# Patient Record
Sex: Female | Born: 1985 | Race: White | Hispanic: No | Marital: Single | Smoking: Never smoker
Health system: Southern US, Community
[De-identification: ages and names within clinical notes are randomized; demographics above are authoritative.]

## PROBLEM LIST (undated history)

## (undated) DIAGNOSIS — I4891 Unspecified atrial fibrillation: Secondary | ICD-10-CM

## (undated) DIAGNOSIS — I341 Nonrheumatic mitral (valve) prolapse: Secondary | ICD-10-CM

## (undated) HISTORY — PX: OTHER SURGICAL HISTORY: SHX169

---

## 2007-01-26 ENCOUNTER — Other Ambulatory Visit: Admission: RE | Admit: 2007-01-26 | Discharge: 2007-01-26 | Payer: Self-pay | Admitting: Obstetrics and Gynecology

## 2008-04-27 ENCOUNTER — Other Ambulatory Visit: Admission: RE | Admit: 2008-04-27 | Discharge: 2008-04-27 | Payer: Self-pay | Admitting: Obstetrics and Gynecology

## 2009-12-05 ENCOUNTER — Ambulatory Visit: Payer: Self-pay | Admitting: Internal Medicine

## 2013-07-04 DIAGNOSIS — Z0000 Encounter for general adult medical examination without abnormal findings: Secondary | ICD-10-CM | POA: Insufficient documentation

## 2013-07-04 HISTORY — DX: Encounter for general adult medical examination without abnormal findings: Z00.00

## 2016-05-09 ENCOUNTER — Encounter (HOSPITAL_COMMUNITY): Payer: Self-pay | Admitting: *Deleted

## 2016-05-09 ENCOUNTER — Emergency Department (HOSPITAL_COMMUNITY)
Admission: EM | Admit: 2016-05-09 | Discharge: 2016-05-09 | Disposition: A | Discharge status: Home / Self Care | Payer: BLUE CROSS/BLUE SHIELD | Attending: Emergency Medicine | Admitting: Emergency Medicine

## 2016-05-09 ENCOUNTER — Emergency Department (HOSPITAL_COMMUNITY): Payer: BLUE CROSS/BLUE SHIELD

## 2016-05-09 DIAGNOSIS — I4891 Unspecified atrial fibrillation: Secondary | ICD-10-CM | POA: Diagnosis not present

## 2016-05-09 DIAGNOSIS — R002 Palpitations: Secondary | ICD-10-CM | POA: Diagnosis not present

## 2016-05-09 DIAGNOSIS — Z7901 Long term (current) use of anticoagulants: Secondary | ICD-10-CM | POA: Diagnosis not present

## 2016-05-09 DIAGNOSIS — I4902 Ventricular flutter: Secondary | ICD-10-CM | POA: Diagnosis not present

## 2016-05-09 HISTORY — DX: Nonrheumatic mitral (valve) prolapse: I34.1

## 2016-05-09 LAB — CBC
HCT: 44.7 % (ref 36.0–46.0)
Hemoglobin: 15.4 g/dL — ABNORMAL HIGH (ref 12.0–15.0)
MCH: 33.9 pg (ref 26.0–34.0)
MCHC: 34.5 g/dL (ref 30.0–36.0)
MCV: 98.5 fL (ref 78.0–100.0)
Platelets: 421 10*3/uL — ABNORMAL HIGH (ref 150–400)
RBC: 4.54 MIL/uL (ref 3.87–5.11)
RDW: 13.4 % (ref 11.5–15.5)
WBC: 7.7 10*3/uL (ref 4.0–10.5)

## 2016-05-09 LAB — TSH: TSH: 1.307 u[IU]/mL (ref 0.350–4.500)

## 2016-05-09 LAB — I-STAT TROPONIN, ED: Troponin i, poc: 0.01 ng/mL (ref 0.00–0.08)

## 2016-05-09 LAB — BASIC METABOLIC PANEL
Anion gap: 15 (ref 5–15)
BUN: 10 mg/dL (ref 6–20)
CO2: 16 mmol/L — ABNORMAL LOW (ref 22–32)
Calcium: 10.3 mg/dL (ref 8.9–10.3)
Chloride: 108 mmol/L (ref 101–111)
Creatinine, Ser: 0.82 mg/dL (ref 0.44–1.00)
GFR calc Af Amer: >60 mL/min (ref >60)
GFR calc non Af Amer: >60 mL/min (ref >60)
Glucose, Bld: 130 mg/dL — ABNORMAL HIGH (ref 65–99)
Potassium: 4.3 mmol/L (ref 3.5–5.1)
Sodium: 139 mmol/L (ref 135–145)

## 2016-05-09 LAB — I-STAT BETA HCG BLOOD, ED (MC, WL, AP ONLY): I-stat hCG, quantitative: <5 m[IU]/mL (ref <5)

## 2016-05-09 MED ORDER — METOPROLOL SUCCINATE ER 25 MG PO TB24: 25.0000 mg | ORAL_TABLET | Freq: Every day | ORAL | 0 refills | Status: DC

## 2016-05-09 MED ORDER — ETOMIDATE 2 MG/ML IV SOLN
8.0000 mg | Freq: Once | INTRAVENOUS | Status: AC
  Administered 2016-05-09: 8 mg via INTRAVENOUS
  Filled 2016-05-09: qty 10

## 2016-05-09 MED ORDER — DILTIAZEM LOAD VIA INFUSION
10.0000 mg | Freq: Once | INTRAVENOUS | Status: DC
  Filled 2016-05-09: qty 10

## 2016-05-09 MED ORDER — RIVAROXABAN 20 MG PO TABS: 20.0000 mg | ORAL_TABLET | Freq: Every day | ORAL | 0 refills | Status: DC

## 2016-05-09 MED ORDER — ETOMIDATE 2 MG/ML IV SOLN: 7.0000 mg | Freq: Once | INTRAVENOUS | Status: DC

## 2016-05-09 MED ORDER — FENTANYL CITRATE (PF) 100 MCG/2ML IJ SOLN
50.0000 ug | Freq: Once | INTRAMUSCULAR | Status: AC
  Administered 2016-05-09: 50 ug via INTRAVENOUS
  Filled 2016-05-09: qty 2

## 2016-05-09 MED ORDER — DILTIAZEM HCL 100 MG IV SOLR
5.0000 mg/h | INTRAVENOUS | Status: DC
  Filled 2016-05-09: qty 100

## 2016-05-09 NOTE — ED Triage Notes (Addendum)
Pt reports onset this am of palpitations and feeling like HR was irregular. Reports sob. Airway intact at triage and ekg done. HR 200 at triage

## 2016-05-09 NOTE — ED Notes (Signed)
EDP and 2 RNs at bedside. Consent for sedation and cardioversion at bedside. Pt placed on Zoll, CO2 monitor, and bedside 12 lead monitor, BP, O2, and RR all stable at this time. Airway cart, suction, and ambu bag at bedside. Pt placed on 2 L of O2 via nasal cannula.

## 2016-05-09 NOTE — Sedation Documentation (Signed)
Synchronized cardioversion with 100 joules performed. Cardiovert successful. Pt converted to sinus tachycardia.

## 2016-05-09 NOTE — ED Notes (Signed)
Called pharmacy and talked to ED pharmacist to see about getting Cardizem STAT

## 2016-05-09 NOTE — Discharge Instructions (Signed)
Avoid ibuprofen and aspirin while you are taking blood thinners.  If you begin to have dizziness while on metoprolol, you may hold on the medication and discuss with Dr. Sharyn LullHarwani.

## 2016-05-09 NOTE — ED Provider Notes (Signed)
[facility]-EMERGENCY DEPT Provider Note   CSN: [id number] Arrival date & time: 05/09/16  1127     History   Chief Complaint Chief Complaint  Patient presents with  . Palpitations    HPI Allison Pope is a 31 y.o. female.  HPI  31 year old with history of mitral valve prolapse presents with concern of acute onset of palpitations, feeling lightheaded, nauseas.  Patient reports she was brushing her teeth and felt like her toothbrush went to far back in her mouth and she gags after which she developed severe palpitations.  Reports she's had palpitations in the past, however she is able to relax and it went away. Reports in the past they have been brought on by caffeine. Denies any caffeine use today. She is has no history of DVT, no PE, no recent surgery or birth control use. Denies shortness of breath.  No recent illnesses.  Father has hx of atrial fibrillation.  Symptoms developed acutely 1 hour ago. Reports she was otherwise in normal state of health.  Reports she had etoh last night, approximately 3 drinks. No other drug use.    Past Medical History:  Diagnosis Date  . MVP (mitral valve prolapse)     There are no active problems to display for this patient.   History reviewed. No pertinent surgical history.  OB History    No data available       Home Medications    Prior to Admission medications   Medication Sig Start Date End Date Taking? Authorizing Provider  B Complex Vitamins (VITAMIN B COMPLEX) TABS Take 1 tablet by mouth 2 (two) times a week.   Yes Historical Provider, MD  metoprolol succinate (TOPROL-XL) 25 MG 24 hr tablet Take 1 tablet (25 mg total) by mouth daily. 05/09/16   Alvira Monday, MD  rivaroxaban (XARELTO) 20 MG TABS tablet Take 1 tablet (20 mg total) by mouth daily with supper. 05/09/16   Alvira Monday, MD    Family History History reviewed. No pertinent family history.  Social History Social History  Substance Use Topics  . Smoking status: Never  Smoker  . Smokeless tobacco: Not on file  . Alcohol use Yes     Comment: occ     Allergies   Patient has no known allergies.   Review of Systems Review of Systems  Constitutional: Positive for fatigue. Negative for fever.  HENT: Negative for sore throat.   Eyes: Negative for visual disturbance.  Respiratory: Negative for cough and shortness of breath.   Cardiovascular: Positive for palpitations. Negative for chest pain and leg swelling.  Gastrointestinal: Positive for nausea. Negative for abdominal pain, diarrhea and vomiting.  Genitourinary: Negative for difficulty urinating.  Musculoskeletal: Negative for back pain and neck pain.  Skin: Negative for rash.  Neurological: Positive for light-headedness. Negative for syncope and headaches.     Physical Exam Updated Vital Signs BP 115/88   Pulse 96   Temp 98.1 F (36.7 C) (Oral)   Resp 17   LMP 05/04/2016   SpO2 93%   Physical Exam  Constitutional: She is oriented to person, place, and time. She appears well-developed and well-nourished. No distress.  HENT:  Head: Normocephalic and atraumatic.  Eyes: Conjunctivae and EOM are normal.  Neck: Normal range of motion.  Cardiovascular: Normal heart sounds and intact distal pulses.  An irregularly irregular rhythm present. Tachycardia present.  Exam reveals no gallop and no friction rub.   No murmur heard. Pulmonary/Chest: Effort normal and breath sounds normal. No respiratory  distress. She has no wheezes. She has no rales.  Abdominal: Soft. She exhibits no distension. There is no tenderness. There is no guarding.  Musculoskeletal: She exhibits no edema or tenderness.  Neurological: She is alert and oriented to person, place, and time.  Skin: Skin is warm and dry. No rash noted. She is not diaphoretic. No erythema.  Nursing note and vitals reviewed.    ED Treatments / Results  Labs (all labs ordered are listed, but only abnormal results are displayed) Labs Reviewed    BASIC METABOLIC PANEL - Abnormal; Notable for the following:       Result Value   CO2 16 (*)    Glucose, Bld 130 (*)    All other components within normal limits  CBC - Abnormal; Notable for the following:    Hemoglobin 15.4 (*)    Platelets 421 (*)    All other components within normal limits  TSH  I-STAT TROPOININ, ED  I-STAT BETA HCG BLOOD, ED ([facility], [facility], [facility] ONLY)    EKG  EKG Interpretation  Date/Time:  Friday May 09 2016 11:39:17 EST Ventricular Rate:  199 PR Interval:    QRS Duration: 80 QT Interval:  228 QTC Calculation: 414 R Axis:   63 Text Interpretation:  Atrial fibrillation with rapid ventricular response Low voltage QRS Nonspecific ST abnormality Abnormal ECG No previous ECGs available Confirmed by [facility] MD, Deamber Buckhalter ([id number]) on 05/09/2016 11:53:52 AM       Radiology Dg Chest Portable 1 View  Result Date: 05/09/2016 CLINICAL DATA:  Palpitations. EXAM: PORTABLE CHEST 1 VIEW COMPARISON:  None. FINDINGS: The heart size and mediastinal contours are within normal limits. Both lungs are clear. The visualized skeletal structures are unremarkable. IMPRESSION: No active disease. Electronically Signed   By: Signa Kell M.D.   On: 05/09/2016 12:13    Procedures .Cardioversion Date/Time: 05/09/2016 5:43 PM Performed by: Alvira Monday Authorized by: Alvira Monday   Consent:    Consent obtained:  Written   Consent given by:  Patient   Risks discussed:  Death and induced arrhythmia   Alternatives discussed:  Rate-control medication Pre-procedure details:    Rhythm:  Atrial fibrillation Attempt one:    Cardioversion mode:  Synchronous   Shock (Joules):  100   Shock outcome:  Conversion to normal sinus rhythm Post-procedure details:    Patient status:  Alert   Patient tolerance of procedure:  Tolerated well, no immediate complications .Sedation Date/Time: 05/09/2016 5:46 PM Performed by: Alvira Monday Authorized by: Alvira Monday   Consent:     Consent obtained:  Written   Consent given by:  Patient   Risks discussed:  Allergic reaction, dysrhythmia, inadequate sedation, nausea, vomiting and respiratory compromise necessitating ventilatory assistance and intubation Indications:    Procedure performed:  Cardioversion   Procedure necessitating sedation performed by:  Physician performing sedation   Intended level of sedation:  Moderate (conscious sedation) Pre-sedation assessment:    ASA classification: class 2 - patient with mild systemic disease     Neck mobility: normal     Mouth opening:  3 or more finger widths   Thyromental distance:  3 finger widths   Mallampati score:  III - soft palate, base of uvula visible   Pre-sedation assessments completed and reviewed: airway patency, cardiovascular function, mental status and nausea/vomiting     History of difficult intubation: no   Immediate pre-procedure details:    Reassessment: Patient reassessed immediately prior to procedure     Reviewed: vital signs and relevant  labs/tests     Verified: bag valve mask available, emergency equipment available, intubation equipment available and IV patency confirmed   Procedure details (see MAR for exact dosages):    Sedation start time:  05/09/2016 12:33 PM Post-procedure details:    Post-sedation assessment completed:  05/09/2016 12:41 PM   Attendance: Constant attendance by certified staff until patient recovered     Recovery: Patient returned to pre-procedure baseline     Patient is stable for discharge or admission: yes     Patient tolerance:  Tolerated well, no immediate complications Comments:     Pt had side effect of myoclonus which resolved, emergence nausea/vomiting. Appropriate sedation however patient reports feeling scared on initial emergence, tearful which resolved.    (including critical care time)  Medications Ordered in ED Medications  etomidate (AMIDATE) injection 8 mg (8 mg Intravenous Given 05/09/16 1237)  fentaNYL  (SUBLIMAZE) injection 50 mcg (50 mcg Intravenous Given 05/09/16 1237)   CRITICAL CARE: atrial fibrillation with RVR Performed by: Rhae LernerSchlossman, Crystalmarie Yasin Elizabeth   Total critical care time: 30 minutes  Critical care time was exclusive of separately billable procedures and treating other patients.  Critical care was necessary to treat or prevent imminent or life-threatening deterioration.  Critical care was time spent personally by me on the following activities: development of treatment plan with patient and/or surrogate as well as nursing, discussions with consultants, evaluation of patient's response to treatment, examination of patient, obtaining history from patient or surrogate, ordering and performing treatments and interventions, ordering and review of laboratory studies, ordering and review of radiographic studies, pulse oximetry and re-evaluation of patient's condition.    Initial Impression / Assessment and Plan / ED Course  I have reviewed the triage vital signs and the nursing notes.  Pertinent labs & imaging results that were available during my care of the patient were reviewed by me and considered in my medical decision making (see chart for details).    31 year old with history of mitral valve prolapse presents with concern of acute onset of palpitations, feeling lightheaded, nausea which began just prior to arrival.  Patient in atrial fibrillation with rapid ventricular response with a rate of 200 on arrival to the emergency department. Reports she's had prior episodes of palpitations, however did not seek care and they resolved spontaneously. Patient mentating well, hemodynamically stable. Discussed options for treatment including cardioversion with discharge, or attempting to use IV medications for rate control.  Given patient with clear onset of symptoms today, feel that cardioversion is appropriate. Discussed risks and benefits of sedation and cardioversion. Patient was sedated  with etomidate and cardioverted to normal sinus rhythm.  She was observed without conversion back to atrial fibrillation. Electrolytes are within normal limits with exception of bicarb. Suspect this is decreased secondary to dehydration, etoh use last night. TSH is within normal limits. Pregnancy test negative. Hgb WNL/likely hemoconcentrated. Given IV fluid. CXR wnl.  Doubt PE given no hypoxia, no dyspnea, no asymmetric leg swelling, no recent surgery.   Discussed with Dr. Sharyn LullHarwani who will see the patient early next week. She was placed on Xarelto and metoprolol 25mg  XL. CHADSVasc of 1. Patient discharged in stable condition with understanding of reasons to return.   Final Clinical Impressions(s) / ED Diagnoses   Final diagnoses:  Atrial fibrillation with RVR [facility](HCC)    New Prescriptions Discharge Medication List as of 05/09/2016  3:17 PM    START taking these medications   Details  metoprolol succinate (TOPROL-XL) 25 MG 24 hr tablet  Take 1 tablet (25 mg total) by mouth daily., Starting Fri 05/09/2016, Print    rivaroxaban (XARELTO) 20 MG TABS tablet Take 1 tablet (20 mg total) by mouth daily with supper., Starting Fri 05/09/2016, Print         Alvira Monday, MD 05/09/16 1754

## 2016-05-09 NOTE — ED Notes (Signed)
EDP at bedside discussing risks and benefits of cardioversion vs IV medication. Patient verbalizes understanding of risks and benefits. Consented to cardioversion.

## 2016-05-13 ENCOUNTER — Ambulatory Visit (HOSPITAL_COMMUNITY): Payer: Self-pay | Admitting: Nurse Practitioner

## 2016-05-14 DIAGNOSIS — I10 Essential (primary) hypertension: Secondary | ICD-10-CM | POA: Diagnosis not present

## 2016-05-14 DIAGNOSIS — I341 Nonrheumatic mitral (valve) prolapse: Secondary | ICD-10-CM | POA: Diagnosis not present

## 2016-05-14 DIAGNOSIS — I480 Paroxysmal atrial fibrillation: Secondary | ICD-10-CM | POA: Diagnosis not present

## 2016-08-09 ENCOUNTER — Emergency Department (HOSPITAL_COMMUNITY): Payer: Self-pay

## 2016-08-09 ENCOUNTER — Encounter (HOSPITAL_COMMUNITY): Payer: Self-pay | Admitting: Nurse Practitioner

## 2016-08-09 ENCOUNTER — Inpatient Hospital Stay (HOSPITAL_COMMUNITY)
Admission: EM | Admit: 2016-08-09 | Discharge: 2016-08-12 | DRG: 439 | Disposition: A | Discharge status: Home / Self Care | Payer: BLUE CROSS/BLUE SHIELD | Attending: Internal Medicine | Admitting: Internal Medicine

## 2016-08-09 DIAGNOSIS — E876 Hypokalemia: Secondary | ICD-10-CM | POA: Diagnosis present

## 2016-08-09 DIAGNOSIS — Z9119 Patient's noncompliance with other medical treatment and regimen: Secondary | ICD-10-CM

## 2016-08-09 DIAGNOSIS — I480 Paroxysmal atrial fibrillation: Secondary | ICD-10-CM | POA: Diagnosis not present

## 2016-08-09 DIAGNOSIS — F1010 Alcohol abuse, uncomplicated: Secondary | ICD-10-CM | POA: Diagnosis present

## 2016-08-09 DIAGNOSIS — F10239 Alcohol dependence with withdrawal, unspecified: Secondary | ICD-10-CM | POA: Diagnosis present

## 2016-08-09 DIAGNOSIS — R079 Chest pain, unspecified: Secondary | ICD-10-CM | POA: Diagnosis not present

## 2016-08-09 DIAGNOSIS — K852 Alcohol induced acute pancreatitis without necrosis or infection: Principal | ICD-10-CM | POA: Diagnosis present

## 2016-08-09 DIAGNOSIS — F419 Anxiety disorder, unspecified: Secondary | ICD-10-CM | POA: Diagnosis present

## 2016-08-09 DIAGNOSIS — Z8249 Family history of ischemic heart disease and other diseases of the circulatory system: Secondary | ICD-10-CM

## 2016-08-09 DIAGNOSIS — K859 Acute pancreatitis without necrosis or infection, unspecified: Secondary | ICD-10-CM | POA: Diagnosis present

## 2016-08-09 DIAGNOSIS — I10 Essential (primary) hypertension: Secondary | ICD-10-CM | POA: Diagnosis present

## 2016-08-09 HISTORY — DX: Acute pancreatitis without necrosis or infection, unspecified: K85.90

## 2016-08-09 HISTORY — DX: Hypokalemia: E87.6

## 2016-08-09 HISTORY — DX: Paroxysmal atrial fibrillation: I48.0

## 2016-08-09 HISTORY — DX: Unspecified atrial fibrillation: I48.91

## 2016-08-09 LAB — CBC
HCT: 36.1 % (ref 36.0–46.0)
Hemoglobin: 12 g/dL (ref 12.0–15.0)
MCH: 33.7 pg (ref 26.0–34.0)
MCHC: 33.2 g/dL (ref 30.0–36.0)
MCV: 101.4 fL — ABNORMAL HIGH (ref 78.0–100.0)
Platelets: 335 10*3/uL (ref 150–400)
RBC: 3.56 MIL/uL — ABNORMAL LOW (ref 3.87–5.11)
RDW: 14.3 % (ref 11.5–15.5)
WBC: 3.9 10*3/uL — ABNORMAL LOW (ref 4.0–10.5)

## 2016-08-09 LAB — COMPREHENSIVE METABOLIC PANEL
ALT: 63 U/L — ABNORMAL HIGH (ref 14–54)
AST: 137 U/L — ABNORMAL HIGH (ref 15–41)
Albumin: 3.8 g/dL (ref 3.5–5.0)
Alkaline Phosphatase: 54 U/L (ref 38–126)
Anion gap: 16 — ABNORMAL HIGH (ref 5–15)
BUN: <5 mg/dL — ABNORMAL LOW (ref 6–20)
CO2: 20 mmol/L — ABNORMAL LOW (ref 22–32)
Calcium: 8.6 mg/dL — ABNORMAL LOW (ref 8.9–10.3)
Chloride: 104 mmol/L (ref 101–111)
Creatinine, Ser: 0.7 mg/dL (ref 0.44–1.00)
GFR calc Af Amer: >60 mL/min (ref >60)
GFR calc non Af Amer: >60 mL/min (ref >60)
Glucose, Bld: 100 mg/dL — ABNORMAL HIGH (ref 65–99)
Potassium: 3.1 mmol/L — ABNORMAL LOW (ref 3.5–5.1)
Sodium: 140 mmol/L (ref 135–145)
Total Bilirubin: 1 mg/dL (ref 0.3–1.2)
Total Protein: 7 g/dL (ref 6.5–8.1)

## 2016-08-09 LAB — I-STAT TROPONIN, ED: Troponin i, poc: 0 ng/mL (ref 0.00–0.08)

## 2016-08-09 LAB — MAGNESIUM: Magnesium: 1.1 mg/dL — ABNORMAL LOW (ref 1.7–2.4)

## 2016-08-09 LAB — LIPASE, BLOOD: Lipase: 311 U/L — ABNORMAL HIGH (ref 11–51)

## 2016-08-09 MED ORDER — POTASSIUM CHLORIDE CRYS ER 20 MEQ PO TBCR
40.0000 meq | EXTENDED_RELEASE_TABLET | Freq: Once | ORAL | Status: AC
  Administered 2016-08-09: 40 meq via ORAL
  Filled 2016-08-09: qty 2

## 2016-08-09 MED ORDER — ONDANSETRON HCL 4 MG/2ML IJ SOLN
4.0000 mg | Freq: Four times a day (QID) | INTRAMUSCULAR | Status: DC | PRN
  Administered 2016-08-09: 4 mg via INTRAVENOUS
  Filled 2016-08-09: qty 2

## 2016-08-09 MED ORDER — ASPIRIN EC 81 MG PO TBEC
81.0000 mg | DELAYED_RELEASE_TABLET | Freq: Every day | ORAL | Status: DC
  Administered 2016-08-10 – 2016-08-12 (×3): 81 mg via ORAL
  Filled 2016-08-09 (×3): qty 1

## 2016-08-09 MED ORDER — HYDROMORPHONE HCL 1 MG/ML IJ SOLN
1.0000 mg | INTRAMUSCULAR | Status: DC | PRN
  Administered 2016-08-09 – 2016-08-10 (×3): 1 mg via INTRAVENOUS
  Filled 2016-08-09 (×3): qty 1

## 2016-08-09 MED ORDER — METOPROLOL SUCCINATE ER 25 MG PO TB24
25.0000 mg | ORAL_TABLET | Freq: Every day | ORAL | Status: DC
  Administered 2016-08-10 – 2016-08-12 (×3): 25 mg via ORAL
  Filled 2016-08-09 (×3): qty 1

## 2016-08-09 MED ORDER — ONDANSETRON HCL 4 MG/2ML IJ SOLN
4.0000 mg | Freq: Once | INTRAMUSCULAR | Status: AC
  Administered 2016-08-09: 4 mg via INTRAVENOUS
  Filled 2016-08-09: qty 2

## 2016-08-09 MED ORDER — GI COCKTAIL ~~LOC~~
30.0000 mL | Freq: Once | ORAL | Status: AC
  Administered 2016-08-09: 30 mL via ORAL
  Filled 2016-08-09: qty 30

## 2016-08-09 MED ORDER — ONDANSETRON HCL 4 MG PO TABS: 8.0000 mg | ORAL_TABLET | Freq: Four times a day (QID) | ORAL | Status: DC | PRN

## 2016-08-09 MED ORDER — LORAZEPAM 2 MG/ML IJ SOLN
0.5000 mg | Freq: Once | INTRAMUSCULAR | Status: AC
  Administered 2016-08-09: 0.5 mg via INTRAVENOUS
  Filled 2016-08-09: qty 1

## 2016-08-09 MED ORDER — ENOXAPARIN SODIUM 40 MG/0.4ML ~~LOC~~ SOLN
40.0000 mg | SUBCUTANEOUS | Status: DC
  Administered 2016-08-09 – 2016-08-11 (×3): 40 mg via SUBCUTANEOUS
  Filled 2016-08-09 (×3): qty 0.4

## 2016-08-09 MED ORDER — SODIUM CHLORIDE 0.9 % IV BOLUS (SEPSIS)
1000.0000 mL | Freq: Once | INTRAVENOUS | Status: AC
  Administered 2016-08-09: 1000 mL via INTRAVENOUS

## 2016-08-09 MED ORDER — SODIUM CHLORIDE 0.9% FLUSH
3.0000 mL | Freq: Two times a day (BID) | INTRAVENOUS | Status: DC
  Administered 2016-08-09 – 2016-08-10 (×2): 3 mL via INTRAVENOUS

## 2016-08-09 MED ORDER — SODIUM CHLORIDE 0.9 % IV SOLN
INTRAVENOUS | Status: DC
  Administered 2016-08-09: 20:00:00 via INTRAVENOUS

## 2016-08-09 MED ORDER — MORPHINE SULFATE (PF) 4 MG/ML IV SOLN
4.0000 mg | Freq: Once | INTRAVENOUS | Status: AC
  Administered 2016-08-09: 4 mg via INTRAVENOUS
  Filled 2016-08-09: qty 1

## 2016-08-09 MED ORDER — POTASSIUM CHLORIDE IN NACL 20-0.9 MEQ/L-% IV SOLN
INTRAVENOUS | Status: DC
  Administered 2016-08-09 – 2016-08-11 (×5): via INTRAVENOUS
  Filled 2016-08-09 (×6): qty 1000

## 2016-08-09 NOTE — ED Triage Notes (Signed)
Per EMS pt from home with sudden onset of chest pain approximately 45 min ago central- left sharp pains with another dull central pain. Patient endorses some shortness of breath associated. Pt has atrial fibrillation and takes metoprolol with no missed doses. She took an extra dose of metoprolol today as well as 324 of ASA prior to ems arrival. EKG- NSR. Pt endorses increased anxiety due to recent death of pet and job loss.

## 2016-08-09 NOTE — H&P (Signed)
History and Physical  Patient Name: Allison Pope     ZOX:[id number]    DOB: 1985-10-17    DOA: 08/09/2016 PCP: No PCP Per Patient   Patient coming from: Home  Chief Complaint: Palpitations, chest pain, vomiting  HPI: Allison Pope is a 31 y.o. female with a past medical history significant for pAF on metoprolol who presents with epigastric pain for 2 days.  Patient was in her usual state of health until a few months ago when she lost her job and her insurance. Since then she's been drinking more heavily (although she only quantifies this as 2-3 glasses of wine per night, and less than a bottle of wine).  Over the last 6 weeks she's had recurrent vomiting, mostly with cough, not related to food. In the last 2 days she developed some substernal chest pain.  This was initially sharp, nonradiating, central, [place] developed into a dull ache in the center of her chest, her vomiting worsening, and today she noticed she had been in A. fib, didn't respond to her home metoprolol so she came to the ER thinking she was having a heart attack.    ED course: -Temp 99.50F, heart rate 74, respirations and pulse is normal, blood pressure 140/109 -Na 140, K 3.1, Cr 0.7, WBC 3.9K, Hgb 12 macrocytic -Lipase 311 -AST 137, AST 63 -Troponin negative -Chest x-ray clear -Upper quadrant ultrasound showed a fatty liver, gallstones, no dilation of the CBD -ECG showed sinus rhythm -She was given GI cocktail which had no effect, then morphine and Zofran which improved her symptoms and TRH were asked to evaluate for pancreatitis     She has no previous history of gallstones, no previous history of pancreatitis. She's never had alcohol withdrawal. She's never had more heavy alcohol use, she describes now, describes no binges with greater than 6 drinks in a sitting.  She developed atrial fibrillation in January, CHADS2-VASc 1, Xarelto for one month, not taking aspirin and metoprolol only.  Saw Dr. Sharyn Lull, has no  insurance, doesn't plan to go back.      ROS: Review of Systems  Cardiovascular: Positive for chest pain and palpitations.  Gastrointestinal: Positive for abdominal pain, nausea and vomiting.  Psychiatric/Behavioral: Positive for substance abuse.  All other systems reviewed and are negative.         Past Medical History:  Diagnosis Date  . Atrial fibrillation (HCC)   . MVP (mitral valve prolapse)     History reviewed. No pertinent surgical history.  Social History: Patient lives alone.  The patient walks unassisted.  She was a Designer, industrial/product.  She is not a smoker.  She is significantly underreporting her alcohol use.  Denies IVDU.  From [place], went to [facility].    No Known Allergies  Family history: family history includes Atrial fibrillation in her father; Congestive Heart Failure in her paternal grandmother.  Prior to Admission medications   Medication Sig Start Date End Date Taking? Authorizing Provider  aspirin-acetaminophen-caffeine (EXCEDRIN MIGRAINE) [phone] MG tablet Take 1 tablet by mouth every 6 (six) hours as needed for headache.   Yes Historical Provider, MD  metoprolol succinate (TOPROL-XL) 25 MG 24 hr tablet Take 1 tablet (25 mg total) by mouth daily. 05/09/16  Yes Alvira Monday, MD       Physical Exam: BP (!) 133/94   Pulse 72   Temp 99.7 F (37.6 C) (Oral)   Resp 15   SpO2 98%  General appearance: Well-developed, adult female, alert and in mild  distress from nausea.   Eyes: Anicteric, conjunctiva pink, lids and lashes normal. PERRL.    ENT: No nasal deformity, discharge, epistaxis.  Hearing normal. OP moist without lesions.   Skin: Warm and dry.  No jaundice.  No suspicious rashes or lesions. Cardiac: RRR, nl S1-S2, no murmurs appreciated.  Capillary refill is brisk.  JVP normal.  No LE edema.  Radial and DP pulses 2+ and symmetric. Respiratory: Normal respiratory rate and rhythm.  CTAB without rales or wheezes. Abdomen: Abdomen soft.  Mild  epigastric TTP. No ascites, distension, hepatosplenomegaly.   MSK: No deformities or effusions.  No cyanosis or clubbing. Neuro: Cranial nerves normal.  Sensation intact to light touch. Speech is fluent.  Muscle strength normal.    Psych: Sensorium intact and responding to questions, attention normal.  Behavior appropriate.  Affect normal.  Judgment and insight appear normal.     Labs on Admission:  I have personally reviewed following labs and imaging studies: CBC:  Recent Labs Lab 08/09/16 1533  WBC 3.9*  HGB 12.0  HCT 36.1  MCV 101.4*  PLT 335   Basic Metabolic Panel:  Recent Labs Lab 08/09/16 1533  NA 140  K 3.1*  CL 104  CO2 20*  GLUCOSE 100*  BUN <5*  CREATININE 0.70  CALCIUM 8.6*   GFR: CrCl cannot be calculated (Unknown ideal weight.).  Liver Function Tests:  Recent Labs Lab 08/09/16 1533  AST 137*  ALT 63*  ALKPHOS 54  BILITOT 1.0  PROT 7.0  ALBUMIN 3.8    Recent Labs Lab 08/09/16 1533  LIPASE 311*   No results for input(s): AMMONIA in the last 168 hours. Coagulation Profile: No results for input(s): INR, PROTIME in the last 168 hours. Cardiac Enzymes: No results for input(s): CKTOTAL, CKMB, CKMBINDEX, TROPONINI in the last 168 hours. BNP (last 3 results) No results for input(s): PROBNP in the last 8760 hours. HbA1C: No results for input(s): HGBA1C in the last 72 hours. CBG: No results for input(s): GLUCAP in the last 168 hours. Lipid Profile: No results for input(s): CHOL, HDL, LDLCALC, TRIG, CHOLHDL, LDLDIRECT in the last 72 hours. Thyroid Function Tests: No results for input(s): TSH, T4TOTAL, FREET4, T3FREE, THYROIDAB in the last 72 hours. Anemia Panel: No results for input(s): VITAMINB12, FOLATE, FERRITIN, TIBC, IRON, RETICCTPCT in the last 72 hours. Sepsis Labs: Invalid input(s): PROCALCITONIN, LACTICIDVEN No results found for this or any previous visit (from the past 240 hour(s)).       Radiological Exams on  Admission: Personally reviewed CXR shows no focal opacity or pneumonia, no edema; US abdomen normal: Dg Chest 2 View  Result Date: 08/09/2016 CLINICAL DATA:  Chest pain EXAM: CHEST  2 VIEW COMPARISON:  May 09, 2016 FINDINGS: Lungs are clear. Heart size and pulmonary vascularity are normal. No adenopathy. No pneumothorax. No bone lesions. IMPRESSION: No edema or consolidation. Electronically Signed   By: Bretta Bang III M.D.   On: 08/09/2016 16:13   US Abdomen Limited  Result Date: 08/09/2016 CLINICAL DATA:  Right upper quadrant pain EXAM: US ABDOMEN LIMITED - RIGHT UPPER QUADRANT COMPARISON:  None. FINDINGS: Gallbladder: Well distended with multiple gallstones within. No wall thickening or pericholecystic fluid is noted. Negative sonographic Murphy's sign is noted. Common bile duct: Diameter: 5 mm Liver: Mildly increased in echogenicity which may be related fatty infiltration. No focal mass is seen. IMPRESSION: Cholelithiasis without complicating factors. Likely fatty infiltration of the liver. Electronically Signed   By: Alcide Clever M.D.   On: 08/09/2016  17:59    EKG: Independently reviewed. Rate 72, sinus rhythm, no ST changes.           Assessment/Plan  1. Acute pancreatitis:  LFTs up.  No previous pancreatitis or biliary disease.     -Trend CMP -If trending up, will consider MRCP to rule out impacted stone -NPO  -MIVF -Hydromorphone and ondansteron for symptom relief -Counseled alcohol reduction   2. Atrial fibrillation:  CHADS2-VASc 1. On metoprolol. -Repeat troponin in AM -Continue metoprolol and aspirin  3. Hypokalemia:  -Check mag -IVF with K -Check BMP tomorrow  4. Elevated LFTs:  She minimizes use even with father out of room, but given macrocytosis and hepatic steatosis and AST/ALT ratio, strongly suspect this is alcoholic hepatitis and pancreatitis. -Trend CMP           DVT prophylaxis: Lovenox  Code Status: FULL  Family Communication:  Father at bedside  Disposition Plan: Anticipate IV fluids, NPO, consevative mgmt of pancreatitis.  She is motivated to go home and may actually be able to be observed overnight, advance to fluids tomorrow and be discharged home tomorrow evening if LFTs trending to normal Consults called: Noen Admission status: INpatient        Medical decision making: Patient seen at 7:30 PM on 08/09/2016.  The patient was discussed with Dr. Anitra Lauth.  What exists of the patient's chart was reviewed in depth and summarized above.  Clinical condition: stable.        Alberteen Sam [facility] Pager [phone]

## 2016-08-09 NOTE — ED Notes (Signed)
Patient is resting

## 2016-08-09 NOTE — ED Provider Notes (Signed)
[facility] DEPT Provider Note   CSN: [id number] Arrival date & time: 08/09/16  1520     History   Chief Complaint Chief Complaint  Patient presents with  . Chest Pain    HPI Allison Pope is a 31 y.o. female.  Patient is a 31 year old female with a significant past medical history of mitral valve prolapse and recently diagnosed atrial fibrillation in January. Patient has been taking metoprolol regularly but no longer takes anticoagulation. Asian states approximately one month ago she lost her job and she has been under a lot of stress and has been very nervous. She has had intermittent bouts of atrial fibrillation which usually resolve with rest and deep breathing. She states today she has felt jittery and nervous. She has had several episodes of atrial fibrillation but around 12:30 she started having chest pressure in the lower sternal area. It patient notes that she has been coughing because of allergies which causes her to gag and vomit. She has been vomiting almost every day that she describes as a yellow substance and bile. She denies any fevers. Also having some mild epigastric tenderness. She denies any complaints of reflux. However she has had decreased sleep and eating less because of all the stress. She denies any depression or suicidal thoughts.   The history is provided by the patient.  Chest Pain   This is a new problem. The current episode started 1 to 2 hours ago. Episode frequency: intermittent. The problem has been gradually improving. The pain is associated with an emotional upset. The pain is present in the substernal region. The pain is at a severity of 5/10. The pain is moderate. The quality of the pain is described as sharp (intially pain was pressure in the bottom of sternum but now just having sharp pangs that last seconds in the same place). The pain does not radiate. Associated symptoms include palpitations and vomiting. Pertinent negatives include no dizziness, no  shortness of breath, no sputum production and no weakness. Associated symptoms comments: Patient has a history of atrial fibrillation and states today she has been having intermittent A. fib all day long but is not currently having it now. When the A. fib comes on she develops shortness of breath and dizziness. However this discomfort in her chest today was different.. She has tried rest for the symptoms. The treatment provided no relief. There are no known risk factors. Past medical history comments: Atrial fibrillation and mitral valve prolapse. Patient only took 30 days of anticoagulation is not taking it currently    Past Medical History:  Diagnosis Date  . Atrial fibrillation (HCC)   . MVP (mitral valve prolapse)     There are no active problems to display for this patient.   History reviewed. No pertinent surgical history.  OB History    No data available       Home Medications    Prior to Admission medications   Medication Sig Start Date End Date Taking? Authorizing Provider  aspirin-acetaminophen-caffeine (EXCEDRIN MIGRAINE) [phone] MG tablet Take 1 tablet by mouth every 6 (six) hours as needed for headache.   Yes Historical Provider, MD  metoprolol succinate (TOPROL-XL) 25 MG 24 hr tablet Take 1 tablet (25 mg total) by mouth daily. 05/09/16  Yes Alvira Monday, MD  rivaroxaban (XARELTO) 20 MG TABS tablet Take 1 tablet (20 mg total) by mouth daily with supper. Patient not taking: Reported on 08/09/2016 05/09/16   Alvira Monday, MD    Family History No  family history on file.  Social History Social History  Substance Use Topics  . Smoking status: Never Smoker  . Smokeless tobacco: Not on file  . Alcohol use Yes     Comment: occ     Allergies   Patient has no known allergies.   Review of Systems Review of Systems  Respiratory: Negative for sputum production and shortness of breath.   Cardiovascular: Positive for chest pain and palpitations.    Gastrointestinal: Positive for vomiting.  Neurological: Negative for dizziness and weakness.  All other systems reviewed and are negative.    Physical Exam Updated Vital Signs BP (!) 140/109 (BP Location: Right Arm)   Pulse 77   Temp 99.7 F (37.6 C) (Oral)   Resp 18   SpO2 97%   Physical Exam  Constitutional: She is oriented to person, place, and time. She appears well-developed and well-nourished.  HENT:  Head: Normocephalic and atraumatic.  Mouth/Throat: Oropharynx is clear and moist.  Eyes: Conjunctivae and EOM are normal. Pupils are equal, round, and reactive to light.  Neck: Normal range of motion. Neck supple.  Cardiovascular: Normal rate, regular rhythm and intact distal pulses.   No murmur heard. Pulmonary/Chest: Effort normal and breath sounds normal. No respiratory distress. She has no wheezes. She has no rales.  Abdominal: Soft. She exhibits no distension. There is tenderness in the epigastric area. There is no rebound and no guarding.  Mild epigastric tenderness  Musculoskeletal: Normal range of motion. She exhibits no edema or tenderness.  Neurological: She is alert and oriented to person, place, and time.  Skin: Skin is warm and dry. No rash noted. No erythema.  Psychiatric: She has a normal mood and affect. Her behavior is normal.  Seems slightly anxious  Nursing note and vitals reviewed.    ED Treatments / Results  Labs (all labs ordered are listed, but only abnormal results are displayed) Labs Reviewed  CBC - Abnormal; Notable for the following:       Result Value   WBC 3.9 (*)    RBC 3.56 (*)    MCV 101.4 (*)    All other components within normal limits  COMPREHENSIVE METABOLIC PANEL - Abnormal; Notable for the following:    Potassium 3.1 (*)    CO2 20 (*)    Glucose, Bld 100 (*)    BUN <5 (*)    Calcium 8.6 (*)    AST 137 (*)    ALT 63 (*)    Anion gap 16 (*)    All other components within normal limits  LIPASE, BLOOD - Abnormal; Notable  for the following:    Lipase 311 (*)    All other components within normal limits  I-STAT TROPOININ, ED    EKG  EKG Interpretation  Date/Time:  Saturday August 09 2016 15:26:44 EDT Ventricular Rate:  76 PR Interval:    QRS Duration: 102 QT Interval:  406 QTC Calculation: 457 R Axis:   28 Text Interpretation:  Sinus rhythm Consider anterior infarct Confirmed by Anitra Lauth  MD, Jelitza Manninen ([id number]) on 08/09/2016 3:30:59 PM       Radiology Dg Chest 2 View  Result Date: 08/09/2016 CLINICAL DATA:  Chest pain EXAM: CHEST  2 VIEW COMPARISON:  May 09, 2016 FINDINGS: Lungs are clear. Heart size and pulmonary vascularity are normal. No adenopathy. No pneumothorax. No bone lesions. IMPRESSION: No edema or consolidation. Electronically Signed   By: Bretta Bang III M.D.   On: 08/09/2016 16:13   US Abdomen Limited  Result Date: 08/09/2016 CLINICAL DATA:  Right upper quadrant pain EXAM: US ABDOMEN LIMITED - RIGHT UPPER QUADRANT COMPARISON:  None. FINDINGS: Gallbladder: Well distended with multiple gallstones within. No wall thickening or pericholecystic fluid is noted. Negative sonographic Murphy's sign is noted. Common bile duct: Diameter: 5 mm Liver: Mildly increased in echogenicity which may be related fatty infiltration. No focal mass is seen. IMPRESSION: Cholelithiasis without complicating factors. Likely fatty infiltration of the liver. Electronically Signed   By: Alcide Clever M.D.   On: 08/09/2016 17:59    Procedures Procedures (including critical care time)  Medications Ordered in ED Medications  gi cocktail (Maalox,Lidocaine,Donnatal) (30 mLs Oral Given 08/09/16 1549)  LORazepam (ATIVAN) injection 0.5 mg (0.5 mg Intravenous Given 08/09/16 1549)     Initial Impression / Assessment and Plan / ED Course  I have reviewed the triage vital signs and the nursing notes.  Pertinent labs & imaging results that were available during my care of the patient were reviewed by me and considered  in my medical decision making (see chart for details).     Patient presenting today with atypical chest pain that started this afternoon. She describes pain as a pressure but that is gone and now having the sharp tinges of pain. Feel most likely the pain is GI related. patient has been under a lot of stress recently and has had insomnia and decreased eating. She is been vomiting more and has mild epigastric tenderness. She also seems nervous on exam and states the chest from being under all the stress of recently losing her job. She denies taking any stimulants, over-the-counter cough or cold medications.  Patient is in sinus rhythm today and EKG without ST elevation or concerns of ischemia. She has no symptoms concerning for pericarditis. Symptoms are not significant for PE or dissection. PERC neg.  LMP was a few days ago and low suspicion that patient has appendicitis, ectopic pregnancy or other abdominal process. Chest x-ray within normal limits today, CBC with mild leukopenia but otherwise normal hemoglobin. Patient had TSH checked in January which was within normal limits. She was given some Ativan and GI cocktail.  5:08 PM Labs are significant for elevated LFTs AST greater than ALT and elevated lipase at 311. Patient does state she has been drinking 2-3 glasses of alcohol in the evenings since losing her job with the stress. On reevaluation she denies right upper quadrant pain but on exam she has a mild Murphy sign. Patient was given pain control and IV fluids. GI cocktail only minimally improved her pain. Will do a right upper quadrant ultrasound to ensure no gallbladder issues. There is no medication she is on which would cause this. Could be related to her recent increased alcohol use.  6:35 PM U/S without biliary obstruction.  Will admit for pancreatitis  Final Clinical Impressions(s) / ED Diagnoses   Final diagnoses:  Acute pancreatitis without infection or necrosis, unspecified  pancreatitis type    New Prescriptions New Prescriptions   No medications on file     Gwyneth Sprout, MD 08/09/16 1836

## 2016-08-09 NOTE — ED Notes (Signed)
Pt transported to US

## 2016-08-10 DIAGNOSIS — E876 Hypokalemia: Secondary | ICD-10-CM | POA: Diagnosis not present

## 2016-08-10 DIAGNOSIS — F1010 Alcohol abuse, uncomplicated: Secondary | ICD-10-CM | POA: Diagnosis not present

## 2016-08-10 DIAGNOSIS — I480 Paroxysmal atrial fibrillation: Secondary | ICD-10-CM

## 2016-08-10 DIAGNOSIS — K859 Acute pancreatitis without necrosis or infection, unspecified: Secondary | ICD-10-CM | POA: Diagnosis not present

## 2016-08-10 LAB — COMPREHENSIVE METABOLIC PANEL
ALT: 50 U/L (ref 14–54)
AST: 90 U/L — ABNORMAL HIGH (ref 15–41)
Albumin: 3.5 g/dL (ref 3.5–5.0)
Alkaline Phosphatase: 47 U/L (ref 38–126)
Anion gap: 11 (ref 5–15)
BUN: <5 mg/dL — ABNORMAL LOW (ref 6–20)
CO2: 24 mmol/L (ref 22–32)
Calcium: 8 mg/dL — ABNORMAL LOW (ref 8.9–10.3)
Chloride: 104 mmol/L (ref 101–111)
Creatinine, Ser: 0.76 mg/dL (ref 0.44–1.00)
GFR calc Af Amer: >60 mL/min (ref >60)
GFR calc non Af Amer: >60 mL/min (ref >60)
Glucose, Bld: 78 mg/dL (ref 65–99)
Potassium: 3.4 mmol/L — ABNORMAL LOW (ref 3.5–5.1)
Sodium: 139 mmol/L (ref 135–145)
Total Bilirubin: 1.1 mg/dL (ref 0.3–1.2)
Total Protein: 6.2 g/dL — ABNORMAL LOW (ref 6.5–8.1)

## 2016-08-10 LAB — TROPONIN I: Troponin I: <0.03 ng/mL (ref <0.03)

## 2016-08-10 LAB — LIPID PANEL
Cholesterol: 176 mg/dL (ref 0–200)
HDL: 74 mg/dL (ref >40)
LDL Cholesterol: 81 mg/dL (ref 0–99)
Total CHOL/HDL Ratio: 2.4 RATIO
Triglycerides: 105 mg/dL (ref <150)
VLDL: 21 mg/dL (ref 0–40)

## 2016-08-10 LAB — HIV ANTIBODY (ROUTINE TESTING W REFLEX): HIV Screen 4th Generation wRfx: NONREACTIVE

## 2016-08-10 LAB — MAGNESIUM: Magnesium: 1.2 mg/dL — ABNORMAL LOW (ref 1.7–2.4)

## 2016-08-10 LAB — VITAMIN B12: Vitamin B-12: 1046 pg/mL — ABNORMAL HIGH (ref 180–914)

## 2016-08-10 LAB — LIPASE, BLOOD: Lipase: 280 U/L — ABNORMAL HIGH (ref 11–51)

## 2016-08-10 MED ORDER — FOLIC ACID 1 MG PO TABS
1.0000 mg | ORAL_TABLET | Freq: Every day | ORAL | Status: DC
  Administered 2016-08-10 – 2016-08-12 (×3): 1 mg via ORAL
  Filled 2016-08-10 (×3): qty 1

## 2016-08-10 MED ORDER — THIAMINE HCL 100 MG/ML IJ SOLN: 100.0000 mg | Freq: Every day | INTRAMUSCULAR | Status: DC

## 2016-08-10 MED ORDER — POTASSIUM CHLORIDE CRYS ER 20 MEQ PO TBCR
20.0000 meq | EXTENDED_RELEASE_TABLET | Freq: Once | ORAL | Status: AC
  Administered 2016-08-10: 20 meq via ORAL
  Filled 2016-08-10: qty 1

## 2016-08-10 MED ORDER — LORAZEPAM 2 MG/ML IJ SOLN
1.0000 mg | Freq: Four times a day (QID) | INTRAMUSCULAR | Status: DC | PRN
  Administered 2016-08-11: 1 mg via INTRAVENOUS
  Filled 2016-08-10: qty 1

## 2016-08-10 MED ORDER — VITAMIN B-1 100 MG PO TABS
100.0000 mg | ORAL_TABLET | Freq: Every day | ORAL | Status: DC
  Administered 2016-08-10 – 2016-08-12 (×3): 100 mg via ORAL
  Filled 2016-08-10 (×3): qty 1

## 2016-08-10 MED ORDER — MAGNESIUM SULFATE 4 GM/100ML IV SOLN
4.0000 g | Freq: Once | INTRAVENOUS | Status: AC
  Administered 2016-08-10: 4 g via INTRAVENOUS
  Filled 2016-08-10: qty 100

## 2016-08-10 MED ORDER — HYDRALAZINE HCL 20 MG/ML IJ SOLN: 10.0000 mg | Freq: Four times a day (QID) | INTRAMUSCULAR | Status: DC | PRN

## 2016-08-10 MED ORDER — MAGNESIUM SULFATE 50 % IJ SOLN
4.0000 g | Freq: Once | INTRAVENOUS | Status: DC
  Filled 2016-08-10: qty 8

## 2016-08-10 MED ORDER — ADULT MULTIVITAMIN W/MINERALS CH
1.0000 | ORAL_TABLET | Freq: Every day | ORAL | Status: DC
  Administered 2016-08-10 – 2016-08-12 (×3): 1 via ORAL
  Filled 2016-08-10 (×3): qty 1

## 2016-08-10 MED ORDER — LORAZEPAM 1 MG PO TABS
1.0000 mg | ORAL_TABLET | Freq: Four times a day (QID) | ORAL | Status: DC | PRN
  Administered 2016-08-10: 1 mg via ORAL
  Filled 2016-08-10: qty 1

## 2016-08-10 NOTE — Progress Notes (Signed)
Patient removed telemetry from her,nurse explained to her the purpose of it and asked permission to placed back to her but the patient refused.She said ''that's the one hurts me on my chest.I don't want that back to me''.

## 2016-08-10 NOTE — Progress Notes (Signed)
[facility]                                                                              Patient Demographics  Allison Pope, is a 31 y.o. female, DOB - 1985-11-04, ZOX:[id number]  Admit date - 08/09/2016   Admitting Physician Alberteen Sam, MD  Outpatient Primary MD for the patient is No PCP Per Patient  Outpatient specialists:   LOS - 1  days    Chief Complaint  Patient presents with  . Chest Pain       Brief summary   Patient is a 31 year old female with history of paroxysmal atrial fibrillation, presented with epigastric pain for 2 days. Patient reported she lost her job, insurance and has been drinking alcohol more heavily. Over the last 6 weeks she had recurrent vomiting, and the last 2 days. Some substernal chest pain. Patient presented to the ED for further workup. Patient was found to have lipase of 311, AST 137, AST 63. Patient was admitted for acute pancreatitis   Assessment & Plan    Principal Problem:   Acute pancreatitis without any necrosis:  - LFTs up. No previous pancreatitis or biliary disease.     - Ultrasound of the abdomen negative for any cholelithiasis, possibly alcohol induced - AST ALT with a pattern of EtOH use, lipase trending down, triglycerides 105 - States abdominal pain is improving, continue IV fluids, start clear liquid diet, pain control -Counseled alcohol reduction    Atrial fibrillation:  CHADS2-VASc 1. On metoprolol. -Serial troponins negative -Continue metoprolol and aspirin   Hypokalemia:  -Replaced  Severe hypomagnesemia - Replaced IV  Elevated LFTs:  She minimizes use even with father out of room, but given macrocytosis and hepatic steatosis and AST/ALT ratio, strongly suspect this is alcoholic hepatitis and pancreatitis. -LFTs improving  Alcohol abuse - placed on CIWA protocol with Ativan - Continue IV fluids, thiamine, folate, MVI  Code Status: Full CODE STATUS DVT Prophylaxis:  Lovenox    Family Communication: Discussed in detail with the patient, all imaging results, lab results explained to the patient   Disposition Plan: Hopefully next 24-48 hours if tolerating solids  Time Spent in minutes   25 minutes  Procedures:  None  Consultants:   None  Antimicrobials:      Medications  Scheduled Meds: . aspirin EC  81 mg Oral Daily  . enoxaparin (LOVENOX) injection  40 mg Subcutaneous Q24H  . folic acid  1 mg Oral Daily  . metoprolol succinate  25 mg Oral Daily  . multivitamin with minerals  1 tablet Oral Daily  . sodium chloride flush  3 mL Intravenous Q12H  . thiamine  100 mg Oral Daily   Or  . thiamine  100 mg Intravenous Daily   Continuous Infusions: . 0.9 % NaCl with KCl 20 mEq / L 150 mL/hr at 08/10/16 0631   PRN Meds:.HYDROmorphone (DILAUDID) injection, LORazepam **OR** LORazepam, ondansetron **OR** ondansetron (ZOFRAN) IV   Antibiotics   Anti-infectives    None        Subjective:   Allison Pope was seen and examined today. Nausea and vomiting improved, abdominal pain is  improving. Afebrile. Patient denies dizziness, chest pain, shortness of breath, D/C, new weakness, numbess, tingling. No acute events overnight.    Objective:   Vitals:   08/09/16 2100 08/10/16 0715 08/10/16 0730 08/10/16 0845  BP: (!) 165/94 (!) 148/95  (!) 167/108  Pulse: 66 72 70 64  Resp: Temp: 98.1 F (36.7 C) 98 F (36.7 C)  98 F (36.7 C)  TempSrc: Oral Oral  Oral  SpO2: 97% 97%  98%  Weight: 90.1 kg (198 lb 9.5 oz)       Intake/Output Summary (Last 24 hours) at 08/10/16 1043 Last data filed at 08/10/16 0935  Gross per 24 hour  Intake          2194.17 ml  Output                0 ml  Net          2194.17 ml     Wt Readings from Last 3 Encounters:  08/09/16 90.1 kg (198 lb 9.5 oz)     Exam  General: Alert and oriented x 3, NAD  HEENT:    Neck: Supple, no JVD, no masses  Cardiovascular: S1 S2 auscultated, no rubs, murmurs or  gallops. Regular rate and rhythm.  Respiratory: Clear to auscultation bilaterally, no wheezing, rales or rhonchi  Gastrointestinal: Soft, Minimal epigastric tenderness, nondistended, + bowel sounds  Ext: no cyanosis clubbing or edema  Neuro: AAOx3, Cr N's II- XII. Strength 5/5 upper and lower extremities bilaterally  Skin: No rashes  Psych: Normal affect and demeanor, alert and oriented x3    Data Reviewed:  I have personally reviewed following labs and imaging studies  Micro Results No results found for this or any previous visit (from the past 240 hour(s)).  Radiology Reports Dg Chest 2 View  Result Date: 08/09/2016 CLINICAL DATA:  Chest pain EXAM: CHEST  2 VIEW COMPARISON:  May 09, 2016 FINDINGS: Lungs are clear. Heart size and pulmonary vascularity are normal. No adenopathy. No pneumothorax. No bone lesions. IMPRESSION: No edema or consolidation. Electronically Signed   By: Bretta Bang III M.D.   On: 08/09/2016 16:13   US Abdomen Limited  Result Date: 08/09/2016 CLINICAL DATA:  Right upper quadrant pain EXAM: US ABDOMEN LIMITED - RIGHT UPPER QUADRANT COMPARISON:  None. FINDINGS: Gallbladder: Well distended with multiple gallstones within. No wall thickening or pericholecystic fluid is noted. Negative sonographic Murphy's sign is noted. Common bile duct: Diameter: 5 mm Liver: Mildly increased in echogenicity which may be related fatty infiltration. No focal mass is seen. IMPRESSION: Cholelithiasis without complicating factors. Likely fatty infiltration of the liver. Electronically Signed   By: Alcide Clever M.D.   On: 08/09/2016 17:59    Lab Data:  CBC:  Recent Labs Lab 08/09/16 1533  WBC 3.9*  HGB 12.0  HCT 36.1  MCV 101.4*  PLT 335   Basic Metabolic Panel:  Recent Labs Lab 08/09/16 1533 08/09/16 2104 08/10/16 0442  NA 140  --  139  K 3.1*  --  3.4*  CL 104  --  104  CO2 20*  --  24  GLUCOSE 100*  --  78  BUN <5*  --  <5*  CREATININE 0.70  --  0.76   CALCIUM 8.6*  --  8.0*  MG  --  1.1* 1.2*   GFR: CrCl cannot be calculated (Unknown ideal weight.). Liver Function Tests:  Recent Labs Lab 08/09/16 1533 08/10/16 0442  AST 137* 90*  ALT 63* 50  ALKPHOS 54 47  BILITOT 1.0 1.1  PROT 7.0 6.2*  ALBUMIN 3.8 3.5    Recent Labs Lab 08/09/16 1533 08/10/16 0442  LIPASE 311* 280*   No results for input(s): AMMONIA in the last 168 hours. Coagulation Profile: No results for input(s): INR, PROTIME in the last 168 hours. Cardiac Enzymes:  Recent Labs Lab 08/10/16 0442  TROPONINI <0.03   BNP (last 3 results) No results for input(s): PROBNP in the last 8760 hours. HbA1C: No results for input(s): HGBA1C in the last 72 hours. CBG: No results for input(s): GLUCAP in the last 168 hours. Lipid Profile:  Recent Labs  08/10/16 0744  CHOL 176  HDL 74  LDLCALC 81  TRIG 105  CHOLHDL 2.4   Thyroid Function Tests: No results for input(s): TSH, T4TOTAL, FREET4, T3FREE, THYROIDAB in the last 72 hours. Anemia Panel:  Recent Labs  08/10/16 0442  VITAMINB12 1,046*   Urine analysis: No results found for: COLORURINE, APPEARANCEUR, LABSPEC, PHURINE, GLUCOSEU, HGBUR, BILIRUBINUR, KETONESUR, PROTEINUR, UROBILINOGEN, NITRITE, LEUKOCYTESUR   Ripudeep Rai M.D. [facility] 08/10/2016, 10:43 AM  Pager: [phone] Between 7am to 7pm - call Pager - [phone]  After 7pm go to [email or web address] - password [email or web address]  Call night coverage person covering after 7pm

## 2016-08-10 NOTE — Progress Notes (Signed)
Called CCMD for tele review for the last 12 hours,been NSR since admission.

## 2016-08-10 NOTE — Progress Notes (Signed)
Patient totally removed all the leads of her telemetry.Nurse explained to her the purpose of telemetry.Patient agreed to placed it back.

## 2016-08-11 DIAGNOSIS — K859 Acute pancreatitis without necrosis or infection, unspecified: Secondary | ICD-10-CM | POA: Diagnosis not present

## 2016-08-11 DIAGNOSIS — I480 Paroxysmal atrial fibrillation: Secondary | ICD-10-CM | POA: Diagnosis not present

## 2016-08-11 DIAGNOSIS — E876 Hypokalemia: Secondary | ICD-10-CM | POA: Diagnosis not present

## 2016-08-11 LAB — COMPREHENSIVE METABOLIC PANEL
ALT: 70 U/L — ABNORMAL HIGH (ref 14–54)
AST: 130 U/L — ABNORMAL HIGH (ref 15–41)
Albumin: 4.1 g/dL (ref 3.5–5.0)
Alkaline Phosphatase: 55 U/L (ref 38–126)
Anion gap: 12 (ref 5–15)
BUN: <5 mg/dL — ABNORMAL LOW (ref 6–20)
CO2: 20 mmol/L — ABNORMAL LOW (ref 22–32)
Calcium: 8.1 mg/dL — ABNORMAL LOW (ref 8.9–10.3)
Chloride: 102 mmol/L (ref 101–111)
Creatinine, Ser: 0.63 mg/dL (ref 0.44–1.00)
GFR calc Af Amer: >60 mL/min (ref >60)
GFR calc non Af Amer: >60 mL/min (ref >60)
Glucose, Bld: 83 mg/dL (ref 65–99)
Potassium: 3.7 mmol/L (ref 3.5–5.1)
Sodium: 134 mmol/L — ABNORMAL LOW (ref 135–145)
Total Bilirubin: 1.3 mg/dL — ABNORMAL HIGH (ref 0.3–1.2)
Total Protein: 7.1 g/dL (ref 6.5–8.1)

## 2016-08-11 LAB — LIPASE, BLOOD: Lipase: 192 U/L — ABNORMAL HIGH (ref 11–51)

## 2016-08-11 LAB — MAGNESIUM: Magnesium: 1.8 mg/dL (ref 1.7–2.4)

## 2016-08-11 MED ORDER — WHITE PETROLATUM GEL
Status: AC
  Filled 2016-08-11: qty 1

## 2016-08-11 MED ORDER — AMLODIPINE BESYLATE 5 MG PO TABS: 5.0000 mg | ORAL_TABLET | Freq: Every day | ORAL | 1 refills | Status: DC

## 2016-08-11 MED ORDER — AMLODIPINE BESYLATE 5 MG PO TABS
5.0000 mg | ORAL_TABLET | Freq: Every day | ORAL | Status: DC
Start: 2016-08-11 — End: 2016-08-12
  Administered 2016-08-11 – 2016-08-12 (×2): 5 mg via ORAL
  Filled 2016-08-11 (×2): qty 1

## 2016-08-11 MED ORDER — THIAMINE HCL 100 MG PO TABS: 100.0000 mg | ORAL_TABLET | Freq: Every day | ORAL | 3 refills | Status: DC

## 2016-08-11 MED ORDER — PROMETHAZINE HCL 25 MG PO TABS: 25.0000 mg | ORAL_TABLET | Freq: Three times a day (TID) | ORAL | 0 refills | Status: DC | PRN

## 2016-08-11 MED ORDER — TRAMADOL HCL 50 MG PO TABS: 50.0000 mg | ORAL_TABLET | Freq: Three times a day (TID) | ORAL | 0 refills | Status: DC | PRN

## 2016-08-11 MED ORDER — LORAZEPAM 2 MG/ML IJ SOLN
2.0000 mg | Freq: Once | INTRAMUSCULAR | Status: AC
  Administered 2016-08-11: 2 mg via INTRAVENOUS
  Filled 2016-08-11: qty 1

## 2016-08-11 NOTE — Care Management Note (Signed)
Case Management Note  Patient Details  Name: Allison Pope MRN: [id number] Date of Birth: Mar 30, 1986  Subjective/Objective:        CM following for progression and d/c planning.             Action/Plan: Pt scheduled at [facility] for hospital follow up as overflow due to lack of availability at [facility] and [facility]. Pt informed.   Expected Discharge Date:   08/11/2016               Expected Discharge Plan:  Home/Self Care  In-House Referral:  NA  Discharge planning Services  CM Consult, [facility]  Post Acute Care Choice:  NA Choice offered to:  Patient  DME Arranged:  N/A DME Agency:  NA  HH Arranged:  NA HH Agency:  NA  Status of Service:  Completed, signed off  If discussed at Long Length of Stay Meetings, dates discussed:    Additional Comments:  Starlyn Skeans, RN 08/11/2016, 9:47 AM

## 2016-08-11 NOTE — Progress Notes (Signed)
Per Dr. Isidoro Donning pt is now involuntary committed. Dr. Isidoro Donning talked to patient about this decision and plans to reassess her in the morning. Patients belongings were taken from room except glasses, a book, and contact case. Safety sitter at bedside. Will continue to monitor closely.

## 2016-08-11 NOTE — Progress Notes (Signed)
Pt is alert and oriented x4. Pt is calm, happy and cooperative. Pt tolerated lunch well.  CIWA 0. Will continue to monitor patient.

## 2016-08-11 NOTE — Progress Notes (Signed)
Nutrition Brief Note  Patient identified on the Malnutrition Screening Tool (MST) Report  Wt Readings from Last 15 Encounters:  08/10/16 207 lb 14.4 oz (94.3 kg)    Body mass index is 33.56 kg/m. Patient meets criteria for obesity unspecified based on current BMI.   Current diet order is Heart Healthy, patient is consuming approximately 75-100% of meals at this time. Labs and medications reviewed.   No nutrition interventions warranted at this time. If nutrition issues arise, please consult RD.   Romelle Starcher MS, RD, LDN [phone] Pager  [phone] Weekend/On-Call Pager

## 2016-08-11 NOTE — Progress Notes (Signed)
Patient came out into the hallway stating there were snakes in her room.  I informed patient there were no snacks found in her room.  Patient still stating there are snakes in her room. Eber Jones ,RN notified.

## 2016-08-11 NOTE — Progress Notes (Signed)
While doing my initial assessment on this patient she is standing outside the room with her belongings stating that "there are snakes all over the floor in her room." She told me they were wrapped around the wheels of the bed and recliner. Patient also states that she is not going to eat her dinner; "I am not hungry, I eat healthy and the food here is not healthy. I am 31 and do not need anyone to tell me when to eat. Notified Dr. Isidoro Donning. Awaiting MD response. Will continue to monitor closely.

## 2016-08-11 NOTE — Progress Notes (Signed)
Went to hang bag of IVF and patient was anxious and trying to take out her IV. I then saw the patient grab what looked like a knife off the sink and placed it in her bag. I calmed the patient down and advised her to keep her PIV in. I then left the room and brought Thermon Leyland, RN with me to ask the patient if she had a knife and she admitted that she did. States, "So many people keep opening my door and looking in here." I explained to patient that it is against policy to have any weapons in the hospital. Patient verbalizes understanding and willingly handed over the knife. Pt also displaying thoughts of paranoia, and auditory hallucinations. States, "I haven't been able to get any sleep because of the kids running up and down the hallway all night." States, "You and everyone here have been judging me since I walked in here. I feel like I'm locked in a cell and I'm a prisoner here." Allowed patient to voice her concerns and comforted patient. Patient agreed to take Ativan  IV for her agitation. Merdis Delay, NP notified. Security notified. AC notified. Will continue to monitor.

## 2016-08-11 NOTE — Plan of Care (Signed)
Called by RN that patient is hallucinating, on separate exam by charge nurse, patient is seeing snakes and hallucinating. At this point, I strongly feel that patient is in acute alcohol withdrawals and needs to [facility] for her safety and treatment.  - I have requested social work to assist me for [employer] paperwork - called psych consult for evaluation - 1:1 sitter - will continue CIWA ptotocol with ativan    Ripudeep Rai M.D. [facility] 08/11/2016, 4:22 PM  Pager: [phone]

## 2016-08-11 NOTE — Progress Notes (Addendum)
[facility]                                                                              Patient Demographics  Allison Pope, is a 31 y.o. female, DOB - [birth year], ZOX:[id number]  Admit date - 08/09/2016   Admitting Physician Alberteen Sam, MD  Outpatient Primary MD for the patient is No PCP Per Patient  Outpatient specialists:   LOS - 2  days    Chief Complaint  Patient presents with  . Chest Pain       Brief summary   Patient is a 31 year old female with history of paroxysmal atrial fibrillation, presented with epigastric pain for 2 days. Patient reported she lost her job, insurance and has been drinking alcohol more heavily. Over the last 6 weeks she had recurrent vomiting, and the last 2 days. Some substernal chest pain. Patient presented to the ED for further workup. Patient was found to have lipase of 311, AST 137, AST 63. Patient was admitted for acute pancreatitis   Assessment & Plan    Principal Problem:   Acute pancreatitis without any necrosis:  - LFTs up. No previous pancreatitis or biliary disease.     - Ultrasound of the abdomen negative for any cholelithiasis, possibly alcohol induced - AST ALT with a pattern of EtOH use, lipase trending down, triglycerides 105 - Abdominal pain improved, no nausea or vomiting, advance to low-fat diet -Counseled alcohol reduction   Alcohol abuse - placed on CIWA protocol with Ativan - Continue IV fluids, thiamine, folate, MVI - Overnight issues noted, currently alert and oriented, no tremors, examined the patient with the nurse, Zahra in the room, patient is calm and cooperative. I do not think, patient needs to be involuntarily committed at this time, however she will need to be observed. I have advance her diet to solids, she wants to go home.  - will follow her to suppertime, advance her diet to soft solids, if patient does not have any agitation, anxiety or signs of withdrawals during the day or  requiring any Ativan, may be DC'd home in the evening. I also explained to her that if she demonstrates any signs of EtOH withdrawals, she will not be discharged home today and will be observed overnight or until she is safe to be discharged home. Patient is agreeable with the plan. - I will reassess her after lunch    Atrial fibrillation:  CHADS2-VASc 1. On metoprolol. -Serial troponins negative -Continue metoprolol and aspirin   Hypokalemia:  -Resolved  Severe hypomagnesemia - Resolved  Elevated LFTs:  She minimizes use even with father out of room, but given macrocytosis and hepatic steatosis and AST/ALT ratio, strongly suspect this is alcoholic hepatitis and pancreatitis.  Elevated BP Possibly due to alcohol, anxiety however all BP readings has been elevated since admission, may have underlying hypertension. Started on Norvasc 5 mg daily   Code Status: Full CODE STATUS DVT Prophylaxis:  Lovenox  Family Communication: Discussed in detail with the patient, all imaging results, lab results explained to the patient   Disposition Plan: if tolerating solids possibly today or tomorrow  Time Spent in minutes  25 minutes  Procedures:  None  Consultants:   None  Antimicrobials:      Medications  Scheduled Meds: . aspirin EC  81 mg Oral Daily  . enoxaparin (LOVENOX) injection  40 mg Subcutaneous Q24H  . folic acid  1 mg Oral Daily  . metoprolol succinate  25 mg Oral Daily  . multivitamin with minerals  1 tablet Oral Daily  . sodium chloride flush  3 mL Intravenous Q12H  . thiamine  100 mg Oral Daily   Or  . thiamine  100 mg Intravenous Daily   Continuous Infusions: . 0.9 % NaCl with KCl 20 mEq / L 150 mL/hr at 08/11/16 0430   PRN Meds:.hydrALAZINE, HYDROmorphone (DILAUDID) injection, LORazepam **OR** LORazepam, ondansetron **OR** ondansetron (ZOFRAN) IV   Antibiotics   Anti-infectives    None        Subjective:   [name] was seen and examined  today. Overnight events noted, Nausea and vomiting improved, no abdominal pain at this time. Somewhat upset about the last night's events. Currently alert and oriented, calm and cooperative with me and the nurse at this time.  Patient denies dizziness, chest pain, shortness of breath, D/C, new weakness, numbess, tingling.  Objective:   Vitals:   08/10/16 0730 08/10/16 0845 08/10/16 1720 08/10/16 2218  BP:  (!) 167/108 (!) 164/100 (!) 167/116  Pulse: 70 64 66 71  Resp:  Temp:  98 F (36.7 C) 98.3 F (36.8 C) 98.8 F (37.1 C)  TempSrc:  Oral Oral Oral  SpO2:  98% 97% 98%  Weight:    94.3 kg (207 lb 14.4 oz)    Intake/Output Summary (Last 24 hours) at 08/11/16 0830 Last data filed at 08/11/16 0600  Gross per 24 hour  Intake          4247.08 ml  Output              300 ml  Net          3947.08 ml     Wt Readings from Last 3 Encounters:  08/10/16 94.3 kg (207 lb 14.4 oz)     Exam  General: Alert and oriented, NAD  HEENT:    Neck: Supple, no JVD, no masses  Cardiovascular: S1 S2 auscultated, no rubs, murmurs or gallops. Regular rate and rhythm.  Respiratory: Clear to auscultation bilaterally, no wheezing, rales or rhonchi  Gastrointestinal: Soft, Nontender, nondistended, + bowel sounds  Ext: no cyanosis clubbing or edema, no tremors  Neuro: AAOx3, Cr N's II- XII. Strength 5/5 upper and lower extremities bilaterally  Skin: No rashes  Psych: at the time of my examination, Normal affect and demeanor, alert and oriented    Data Reviewed:  I have personally reviewed following labs and imaging studies  Micro Results No results found for this or any previous visit (from the past 240 hour(s)).  Radiology Reports Dg Chest 2 View  Result Date: 08/09/2016 CLINICAL DATA:  Chest pain EXAM: CHEST  2 VIEW COMPARISON:  May 09, 2016 FINDINGS: Lungs are clear. Heart size and pulmonary vascularity are normal. No adenopathy. No pneumothorax. No bone lesions.  IMPRESSION: No edema or consolidation. Electronically Signed   By: Bretta Bang III M.D.   On: 08/09/2016 16:13   US Abdomen Limited  Result Date: 08/09/2016 CLINICAL DATA:  Right upper quadrant pain EXAM: US ABDOMEN LIMITED - RIGHT UPPER QUADRANT COMPARISON:  None. FINDINGS: Gallbladder: Well distended with multiple gallstones within. No wall thickening or pericholecystic fluid  is noted. Negative sonographic Murphy's sign is noted. Common bile duct: Diameter: 5 mm Liver: Mildly increased in echogenicity which may be related fatty infiltration. No focal mass is seen. IMPRESSION: Cholelithiasis without complicating factors. Likely fatty infiltration of the liver. Electronically Signed   By: Alcide Clever M.D.   On: 08/09/2016 17:59    Lab Data:  CBC:  Recent Labs Lab 08/09/16 1533  WBC 3.9*  HGB 12.0  HCT 36.1  MCV 101.4*  PLT 335   Basic Metabolic Panel:  Recent Labs Lab 08/09/16 1533 08/09/16 2104 08/10/16 0442 08/11/16 0438  NA 140  --  139 134*  K 3.1*  --  3.4* 3.7  CL 104  --  104 102  CO2 20*  --  24 20*  GLUCOSE 100*  --  78 83  BUN <5*  --  <5* <5*  CREATININE 0.70  --  0.76 0.63  CALCIUM 8.6*  --  8.0* 8.1*  MG  --  1.1* 1.2* 1.8   GFR: CrCl cannot be calculated (Unknown ideal weight.). Liver Function Tests:  Recent Labs Lab 08/09/16 1533 08/10/16 0442 08/11/16 0438  AST 137* 90* 130*  ALT 63* 50 70*  ALKPHOS 54 47 55  BILITOT 1.0 1.1 1.3*  PROT 7.0 6.2* 7.1  ALBUMIN 3.8 3.5 4.1    Recent Labs Lab 08/09/16 1533 08/10/16 0442 08/11/16 0657  LIPASE 311* 280* 192*   No results for input(s): AMMONIA in the last 168 hours. Coagulation Profile: No results for input(s): INR, PROTIME in the last 168 hours. Cardiac Enzymes:  Recent Labs Lab 08/10/16 0442  TROPONINI <0.03   BNP (last 3 results) No results for input(s): PROBNP in the last 8760 hours. HbA1C: No results for input(s): HGBA1C in the last 72 hours. CBG: No results for  input(s): GLUCAP in the last 168 hours. Lipid Profile:  Recent Labs  08/10/16 0744  CHOL 176  HDL 74  LDLCALC 81  TRIG 105  CHOLHDL 2.4   Thyroid Function Tests: No results for input(s): TSH, T4TOTAL, FREET4, T3FREE, THYROIDAB in the last 72 hours. Anemia Panel:  Recent Labs  08/10/16 0442  VITAMINB12 1,046*   Urine analysis: No results found for: COLORURINE, APPEARANCEUR, LABSPEC, PHURINE, GLUCOSEU, HGBUR, BILIRUBINUR, KETONESUR, PROTEINUR, UROBILINOGEN, NITRITE, LEUKOCYTESUR   Kongmeng Santoro M.D. [facility] 08/11/2016, 8:30 AM  Pager: [phone] Between 7am to 7pm - call Pager - [phone]  After 7pm go to [email or web address] - password [email or web address]  Call night coverage person covering after 7pm

## 2016-08-11 NOTE — Clinical Social Work Note (Signed)
Seven day IVC paperwork signed, notarized, and faxed to magistrate. Paperwork on the front of chart. CSW requested patient be served.  Charlynn Court, CSW [phone]

## 2016-08-12 DIAGNOSIS — K859 Acute pancreatitis without necrosis or infection, unspecified: Secondary | ICD-10-CM | POA: Diagnosis not present

## 2016-08-12 DIAGNOSIS — F1010 Alcohol abuse, uncomplicated: Secondary | ICD-10-CM | POA: Diagnosis present

## 2016-08-12 DIAGNOSIS — R0602 Shortness of breath: Secondary | ICD-10-CM

## 2016-08-12 DIAGNOSIS — I480 Paroxysmal atrial fibrillation: Secondary | ICD-10-CM | POA: Diagnosis not present

## 2016-08-12 DIAGNOSIS — R079 Chest pain, unspecified: Secondary | ICD-10-CM

## 2016-08-12 DIAGNOSIS — F10231 Alcohol dependence with withdrawal delirium: Secondary | ICD-10-CM

## 2016-08-12 DIAGNOSIS — E876 Hypokalemia: Secondary | ICD-10-CM | POA: Diagnosis not present

## 2016-08-12 HISTORY — DX: Alcohol abuse, uncomplicated: F10.10

## 2016-08-12 LAB — COMPREHENSIVE METABOLIC PANEL
ALT: 70 U/L — ABNORMAL HIGH (ref 14–54)
AST: 111 U/L — ABNORMAL HIGH (ref 15–41)
Albumin: 4 g/dL (ref 3.5–5.0)
Alkaline Phosphatase: 66 U/L (ref 38–126)
Anion gap: 16 — ABNORMAL HIGH (ref 5–15)
BUN: <5 mg/dL — ABNORMAL LOW (ref 6–20)
CO2: 19 mmol/L — ABNORMAL LOW (ref 22–32)
Calcium: 9.2 mg/dL (ref 8.9–10.3)
Chloride: 102 mmol/L (ref 101–111)
Creatinine, Ser: 0.73 mg/dL (ref 0.44–1.00)
GFR calc Af Amer: >60 mL/min (ref >60)
GFR calc non Af Amer: >60 mL/min (ref >60)
Glucose, Bld: 71 mg/dL (ref 65–99)
Potassium: 3.5 mmol/L (ref 3.5–5.1)
Sodium: 137 mmol/L (ref 135–145)
Total Bilirubin: 1.3 mg/dL — ABNORMAL HIGH (ref 0.3–1.2)
Total Protein: 7.7 g/dL (ref 6.5–8.1)

## 2016-08-12 LAB — LIPASE, BLOOD: Lipase: 361 U/L — ABNORMAL HIGH (ref 11–51)

## 2016-08-12 NOTE — Progress Notes (Signed)
Patient refused IV access, MD made aware

## 2016-08-12 NOTE — Progress Notes (Signed)
Patient discharged to home, no IV no tele, AVS reviewed with patient. Medications returned from pharmacy. Patient confirmed she had all belongings. Follow up appointments made and reviewed with patient including walk-in clinic at [facility]. Patient left floor ambulatory, escorted by staff

## 2016-08-12 NOTE — Consult Note (Signed)
[facility] Face-to-Face Psychiatry Consult   Reason for Consult:  Alcohol abuse and alcoholic withdrawal hallucinations Referring Physician:  Dr. Tana Coast Patient Identification: Allison Pope MRN:  [id number] Principal Diagnosis: Alcohol abuse Diagnosis:   Patient Active Problem List   Diagnosis Date Noted  . Acute pancreatitis without infection or necrosis [K85.90] 08/09/2016  . Hypokalemia [E87.6] 08/09/2016  . AF (paroxysmal atrial fibrillation) ([place]) [I48.0] 08/09/2016    Total Time spent with patient: 1 hour  Subjective:   Allison Pope is a 31 y.o. female patient admitted with Chest pain, shortness of breath and Alcohol withdrawal hallucinations.  HPI:  Allison Pope is a 31 y.o. female, seen, chart reviewed for this face-to-face psychiatric consultation and evaluation of increased symptoms of depression, alcohol abuse and presented with alcohol withdrawal hallucinations and alcohol-induced acute pancreatitis. Patient required supportive therapy and also placed on involuntary commitment as per the physician. Reviewed IVC peppers along with LCSW and reportedly they were not signed by appropriate magistrate office. Patient has been medically stable and her mental status has been improved over the hospitalization. Patient was awake, alert, oriented to time place person and situation. Patient has no current symptoms of psychosis including hallucinations, delusions and paranoia. 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 Patient has been frustrated not able to find a job which resulted financial difficulties and started drinking 2-3 drinks of wine a day instead of 2-3 drinks every weekend. Patient considered herself occasional drinker and does not want to call alcoholic use disorder.  Patient denied suicidal/homicidal ideation, intention or plans. Patient has no evidence of psychosis.  Patient stated she felt like she had a panic attack or heart attack when she had a atrial fibrillation problems which required to complete the hospitalization.   Past Psychiatric History: Patient has no previous history of mental health problems or inpatient or outpatient treatments.  Risk to Self: Is patient at risk for suicide?: No Risk to Others:   Prior Inpatient Therapy:   Prior Outpatient Therapy:    Past Medical History:  Past Medical History:  Diagnosis Date  . Atrial fibrillation ([place])   . MVP (mitral valve prolapse)    History reviewed. No pertinent surgical history. Family History:  Family History  Problem Relation Age of Onset  . Atrial fibrillation Father   . Congestive Heart Failure Paternal Grandmother    Family Psychiatric  History: Patient reported multiple family members drinks alcohol but none of them have alcohol use disorder does not have any history of detox or rehabilitation treatments. Social History:  History  Alcohol Use  . Yes    Comment: occ, 2-3 per day     History  Drug Use No    Social History   Social History  . Marital status: Single    Spouse name: N/A  . Number of children: N/A  . Years of education: N/A   Social History Main Topics  . Smoking status: Never Smoker  . Smokeless tobacco: Never Used  . Alcohol use Yes     Comment: occ, 2-3 per day  . Drug use: No  . Sexual activity: Not Asked   Other Topics Concern  . None   Social History Narrative  . None   Additional Social History:    Allergies:  No Known Allergies  Labs:  Results for orders placed or performed during the hospital encounter of 08/09/16 (from the past 48 hour(s))  Comprehensive metabolic panel     Status: Abnormal   Collection Time: 08/11/16  4:38 AM  Result Value Ref Range   Sodium 134 (L) 135 - 145 mmol/L   Potassium 3.7 3.5 - 5.1 mmol/L   Chloride  102 101 - 111 mmol/L   CO2 20 (L) 22 - 32 mmol/L   Glucose, Bld 83 65 - 99 mg/dL   BUN <5 (L) 6 - 20 mg/dL   Creatinine, Ser 0.63 0.44 - 1.00 mg/dL   Calcium 8.1 (L) 8.9 - 10.3 mg/dL   Total Protein 7.1 6.5 - 8.1 g/dL   Albumin 4.1 3.5 - 5.0 g/dL   AST 130 (H) 15 - 41 U/L   ALT 70 (H) 14 - 54 U/L   Alkaline Phosphatase 55 38 - 126 U/L   Total Bilirubin 1.3 (H) 0.3 - 1.2 mg/dL   GFR calc non Af Amer >60 >60 mL/min   GFR calc Af Amer >60 >60 mL/min    Comment: (NOTE) The eGFR has been calculated using the CKD EPI equation. This calculation has not been validated in all clinical situations. eGFR's persistently <60 mL/min signify possible Chronic Kidney Disease.    Anion gap 12 5 - 15  Magnesium     Status: None   Collection Time: 08/11/16  4:38 AM  Result Value Ref Range   Magnesium 1.8 1.7 - 2.4 mg/dL  Lipase, blood     Status: Abnormal   Collection Time: 08/11/16  6:57 AM  Result Value Ref Range   Lipase 192 (H) 11 - 51 U/L  Comprehensive metabolic panel     Status: Abnormal   Collection Time: 08/12/16  5:43 AM  Result Value Ref Range   Sodium 137 135 - 145 mmol/L   Potassium 3.5 3.5 - 5.1 mmol/L   Chloride 102 101 - 111 mmol/L   CO2 19 (L) 22 - 32 mmol/L   Glucose, Bld 71 65 - 99 mg/dL   BUN <5 (L) 6 - 20 mg/dL   Creatinine, Ser 0.73 0.44 - 1.00 mg/dL   Calcium 9.2 8.9 - 10.3 mg/dL   Total Protein 7.7 6.5 - 8.1 g/dL   Albumin 4.0 3.5 - 5.0 g/dL   AST 111 (H) 15 - 41 U/L   ALT 70 (H) 14 - 54 U/L   Alkaline Phosphatase 66 38 - 126 U/L   Total Bilirubin 1.3 (H) 0.3 - 1.2 mg/dL   GFR calc non Af Amer >60 >60 mL/min   GFR calc Af Amer >60 >60 mL/min    Comment: (NOTE) The eGFR has been calculated using the CKD EPI equation. This calculation has not been validated in all clinical situations. eGFR's persistently <60 mL/min signify possible Chronic Kidney Disease.    Anion gap 16 (H) 5 - 15  Lipase, blood     Status: Abnormal   Collection Time: 08/12/16  5:43 AM   Result Value Ref Range   Lipase 361 (H) 11 - 51 U/L    Current Facility-Administered Medications  Medication Dose Route Frequency Provider Last Rate Last Dose  . 0.9 % NaCl with KCl 20 mEq/ L  infusion   Intravenous Continuous Ripudeep K Rai, MD 150 mL/hr at 08/11/16 0430    . amLODipine (NORVASC) tablet 5 mg  5 mg Oral Daily Ripudeep K Rai, MD   5 mg at 08/12/16 1025  .  aspirin EC tablet 81 mg  81 mg Oral Daily Edwin Dada, MD   81 mg at 08/12/16 1026  . enoxaparin (LOVENOX) injection 40 mg  40 mg Subcutaneous Q24H Edwin Dada, MD   40 mg at 47/42/59 5638  . folic acid (FOLVITE) tablet 1 mg  1 mg Oral Daily Ripudeep K Rai, MD   1 mg at 08/12/16 1026  . hydrALAZINE (APRESOLINE) injection 10 mg  10 mg Intravenous Q6H PRN Ripudeep K Rai, MD      . HYDROmorphone (DILAUDID) injection 1 mg  1 mg Intravenous Q4H PRN Edwin Dada, MD   1 mg at 08/10/16 2336  . LORazepam (ATIVAN) tablet 1 mg  1 mg Oral Q6H PRN Ripudeep Krystal Eaton, MD   1 mg at 08/10/16 2135   Or  . LORazepam (ATIVAN) injection 1 mg  1 mg Intravenous Q6H PRN Ripudeep Krystal Eaton, MD   1 mg at 08/11/16 0429  . metoprolol succinate (TOPROL-XL) 24 hr tablet 25 mg  25 mg Oral Daily Edwin Dada, MD   25 mg at 08/12/16 1025  . multivitamin with minerals tablet 1 tablet  1 tablet Oral Daily Ripudeep Krystal Eaton, MD   1 tablet at 08/12/16 1025  . ondansetron (ZOFRAN) tablet 8 mg  8 mg Oral Q6H PRN Edwin Dada, MD       Or  . ondansetron (ZOFRAN) injection 4 mg  4 mg Intravenous Q6H PRN Edwin Dada, MD   4 mg at 08/09/16 2020  . sodium chloride flush (NS) 0.9 % injection 3 mL  3 mL Intravenous Q12H Edwin Dada, MD   3 mL at 08/10/16 1037  . thiamine (VITAMIN B-1) tablet 100 mg  100 mg Oral Daily Ripudeep K Rai, MD   100 mg at 08/12/16 1026    Musculoskeletal: Strength & Muscle Tone: within normal limits Gait & Station: normal Patient leans: N/A  Psychiatric Specialty Exam: Physical  Exam as per history and physical  ROS patient denied chest pain, shortness of breath, nausea vomiting and abdominal pain.  No Fever-chills, No Headache, No changes with Vision or hearing, reports vertigo No problems swallowing food or Liquids, No Chest pain, Cough or Shortness of Breath, No Abdominal pain, No Nausea or Vommitting, Bowel movements are regular, No Blood in stool or Urine, No dysuria, No new skin rashes or bruises, No new joints pains-aches,  No new weakness, tingling, numbness in any extremity, No recent weight gain or loss, No polyuria, polydypsia or polyphagia,  A full 10 point Review of Systems was done, except as stated above, all other Review of Systems were negative.   Blood pressure 126/86, pulse 74, temperature 98.7 F (37.1 C), temperature source Oral, resp. rate 18, height 5' 6" (1.676 m), weight 94.3 kg (207 lb 14.4 oz), last menstrual period 08/05/2016, SpO2 100 %.Body mass index is 33.56 kg/m.  General Appearance: Casual  Eye Contact:  Good  Speech:  Clear and Coherent  Volume:  Normal  Mood:  Depressed  Affect:  Constricted and Depressed  Thought Process:  Coherent and Goal Directed  Orientation:  Full (Time, Place, and Person)  Thought Content:  WDL  Suicidal Thoughts:  No  Homicidal Thoughts:  No  Memory:  Immediate;   Good Recent;   Fair Remote;   Fair  Judgement:  Fair  Insight:  Good  Psychomotor Activity:  Decreased  Concentration:  Concentration: Good and Attention Span: Good  Recall:  Good  Fund of  Knowledge:  Good  Language:  Good  Akathisia:  Negative  Handed:  Right  AIMS (if indicated):     Assets:  Communication Skills Desire for Improvement Housing Leisure Time Resilience Social Support Talents/Skills  ADL's:  Intact  Cognition:  WNL  Sleep:        Treatment Plan Summary: 31 years old female presented with alcoholic pancreatitis, atrial fibrillation recent increased use of alcohol 2-3 drinks a day since 06/23/2016  reportedly lost her job and also boyfriend and having financial difficulties. Patient denied current symptoms of depression, anxiety, suicidal/homicidal ideation, intention or plans. Patient has no evidence of auditory/visual hallucinations, delusions or paranoia.   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 Appreciate psychiatric consultation and we sign off as of today Please contact [phone] or [phone] if needs further assistance   Disposition: No evidence of imminent risk to self or others at present.   Supportive therapy provided about ongoing stressors. Refer to IOP.-substance abuse  Ambrose Finland, MD 08/12/2016 11:30 AM

## 2016-08-12 NOTE — Discharge Summary (Addendum)
Physician Discharge Summary   [name] MRN: [id number] DOB/AGE: [birth year] 31 y.o.  Admit date: 08/09/2016 Discharge date: 08/12/2016  Primary Care Physician:  No PCP Per Patient  Discharge Diagnoses:    . Acute pancreatitis without infection or necrosis . Hypokalemia . AF (paroxysmal atrial fibrillation) (HCC)   Alcohol use with withdrawals   Medical noncompliance   Consults:   Psychiatry  Recommendations for Outpatient Follow-up:  1. Please repeat CBC/BMET, LFTs at next visit   DIET: Heart healthy diet, low-fat diet    Allergies:  No Known Allergies   DISCHARGE MEDICATIONS: Current Discharge Medication List    START taking these medications   Details  amLODipine (NORVASC) 5 MG tablet Take 1 tablet (5 mg total) by mouth daily. Qty: 30 tablet, Refills: 1    promethazine (PHENERGAN) 25 MG tablet Take 1 tablet (25 mg total) by mouth every 8 (eight) hours as needed for nausea or vomiting. Qty: 20 tablet, Refills: 0    thiamine 100 MG tablet Take 1 tablet (100 mg total) by mouth daily. Qty: 30 tablet, Refills: 3    traMADol (ULTRAM) 50 MG tablet Take 1 tablet (50 mg total) by mouth every 8 (eight) hours as needed for severe pain. Qty: 15 tablet, Refills: 0      CONTINUE these medications which have NOT CHANGED   Details  aspirin-acetaminophen-caffeine (EXCEDRIN MIGRAINE) 250-250-65 MG tablet Take 1 tablet by mouth every 6 (six) hours as needed for headache.    metoprolol succinate (TOPROL-XL) 25 MG 24 hr tablet Take 1 tablet (25 mg total) by mouth daily. Qty: 15 tablet, Refills: 0         Brief H and P: For complete details please refer to admission H and P, but in brief Patient is a 31 year old female with history of paroxysmal atrial fibrillation, presented with epigastric pain for 2 days. Patient reported she lost her job, insurance and has been drinking alcohol more heavily. Over the last 6 weeks she had recurrent vomiting, and the last 2  days. Some substernal chest pain. Patient presented to the ED for further workup. Patient was found to have lipase of 311, AST 137, AST 63. Patient was admitted for acute pancreatitis.  Hospital Course:   Acute pancreatitis without any necrosis: - No previous pancreatitis or biliary disease.  - Ultrasound of the abdomen was negative for any cholelithiasis, possibly alcohol induced - AST ALT elevated with a pattern of EtOH use,, triglycerides 105 - Initially patient was placed on NPO status, aggressive IV fluid hydration however patient lost IV access and refused to put another IV line. She did not have any abdominal pain, diet was slowly advanced to low-fat diet in which she is tolerating well.  - Abdominal pain improved, no nausea or vomiting or abdominal pain -Counseled alcohol cessation   Alcohol use - placed on CIWA protocol with Ativan - Continue IV fluids, thiamine, folate, MVI - On 4/ 30 overnight and during the day, patient was noticed to have some anxiety and agitation issues with episodes of hallucination, seeing things which were not there (cats and snakes in the room).she also had a knife in her back which was confiscated. Due to her safety and being in the withdrawals, she wanted to leave AMA, hence IVC paperwork was filed unclear if it was faxed.  - Psychiatry consult was obtained, patient was assessed by psychiatry on 5/1, she was cleared to be discharged home, currently no hallucinations or psychosis.  Atrial fibrillation: CHADS2-VASc 1. On metoprolol. -Serial troponins negative -Continue metoprolol and aspirin   Hypokalemia: -Resolved  Severe hypomagnesemia - Resolved  Elevated LFTs: She minimizes use even with father out of room, but given macrocytosis and hepatic steatosis and AST/ALT ratio, strongly suspect this is alcoholic hepatitis and pancreatitis.  Hypertension - Continue beta blocker, and started on Norvasc 5 mg daily    Day of  Discharge BP 126/86 (BP Location: Right Arm)   Pulse 74   Temp 98.7 F (37.1 C) (Oral)   Resp 18   Ht  (1.676 m)   Wt 94.3 kg (207 lb 14.4 oz)   LMP 08/05/2016   SpO2 100%   BMI 33.56 kg/m   Physical Exam: General: Alert and awake oriented x3 not in any acute distress. HEENT: anicteric sclera, pupils reactive to light and accommodation CVS: S1-S2 clear no murmur rubs or gallops Chest: clear to auscultation bilaterally, no wheezing rales or rhonchi Abdomen: soft nontender, nondistended, normal bowel sounds Extremities: no cyanosis, clubbing or edema noted bilaterally Neuro: Cranial nerves II-XII intact, no focal neurological deficits   The results of significant diagnostics from this hospitalization (including imaging, microbiology, ancillary and laboratory) are listed below for reference.    LAB RESULTS: Basic Metabolic Panel:  Recent Labs Lab 08/11/16 0438 08/12/16 0543  NA 134* 137  K 3.7 3.5  CL 102 102  CO2 20* 19*  GLUCOSE 83 71  BUN <5* <5*  CREATININE 0.63 0.73  CALCIUM 8.1* 9.2  MG 1.8  --    Liver Function Tests:  Recent Labs Lab 08/11/16 0438 08/12/16 0543  AST 130* 111*  ALT 70* 70*  ALKPHOS 55 66  BILITOT 1.3* 1.3*  PROT 7.1 7.7  ALBUMIN 4.1 4.0    Recent Labs Lab 08/11/16 0657 08/12/16 0543  LIPASE 192* 361*   No results for input(s): AMMONIA in the last 168 hours. CBC:  Recent Labs Lab 08/09/16 1533  WBC 3.9*  HGB 12.0  HCT 36.1  MCV 101.4*  PLT 335   Cardiac Enzymes:  Recent Labs Lab 08/10/16 0442  TROPONINI <0.03   BNP: Invalid input(s): POCBNP CBG: No results for input(s): GLUCAP in the last 168 hours.  Significant Diagnostic Studies:  Dg Chest 2 View  Result Date: 08/09/2016 CLINICAL DATA:  Chest pain EXAM: CHEST  2 VIEW COMPARISON:  May 09, 2016 FINDINGS: Lungs are clear. Heart size and pulmonary vascularity are normal. No adenopathy. No pneumothorax. No bone lesions. IMPRESSION: No edema or  consolidation. Electronically Signed   By: Bretta Bang III M.D.   On: 08/09/2016 16:13   US Abdomen Limited  Result Date: 08/09/2016 CLINICAL DATA:  Right upper quadrant pain EXAM: US ABDOMEN LIMITED - RIGHT UPPER QUADRANT COMPARISON:  None. FINDINGS: Gallbladder: Well distended with multiple gallstones within. No wall thickening or pericholecystic fluid is noted. Negative sonographic Murphy's sign is noted. Common bile duct: Diameter: 5 mm Liver: Mildly increased in echogenicity which may be related fatty infiltration. No focal mass is seen. IMPRESSION: Cholelithiasis without complicating factors. Likely fatty infiltration of the liver. Electronically Signed   By: Alcide Clever M.D.   On: 08/09/2016 17:59    2D ECHO:   Disposition and Follow-up:    DISPOSITION: Home   DISCHARGE FOLLOW-UP Follow-up Information    [facility] Follow up.   Why:  Hospital follow up appointment: Friday: Aug 15, 2016 at 10am Please call [phone] if you ar unable to keep this appointment. Contact  information: [address] [place] [address]       [facility]. Go on 08/12/2016.   Specialty:  Behavioral Health Why:  You can walk in Monday-Friday 8am-3:30pm to be seen for outpatient therapy and medications.  Please bring your ID as well as your hospital discharge paperwork. [facility] has outpatient group options as well. Contact information: [address] [place] [address] [phone]            Time spent on Discharge: 25 minutes  Signed:   Thad Ranger M.D. [facility] 08/12/2016, 12:40 PM Pager: [phone]

## 2016-08-12 NOTE — Progress Notes (Addendum)
12:31 PM Patient has been cleared by psychiatry.  Plan is to set up for outpatient. Patient does not have insurance, thus can follow up at [facility] for [facility] assessment of needs/therapy and groups as a walk in or for crisis. Information placed on patient's AVS. Information relayed to MD and RN. Plan is for patient to DC home today per attending.   LCSW made MD and Psych MD aware that patient IS NOT Involuntary Committed. Paperwork was on chart, unclear if it was faxed however there is not findings and custody order at this time.  Psych MD called and made aware.  At this time reports not necessary to re-IVC and he will come and complete consult.  Will follow up with recommendations once assessment completed.  Deretha Emory, MSW Clinical Social Work: Optician, dispensing Coverage for :  [phone]

## 2016-08-15 ENCOUNTER — Ambulatory Visit: Payer: BLUE CROSS/BLUE SHIELD | Admitting: Family Medicine

## 2017-05-28 ENCOUNTER — Encounter: Payer: Self-pay | Admitting: Cardiology

## 2017-05-28 ENCOUNTER — Ambulatory Visit (INDEPENDENT_AMBULATORY_CARE_PROVIDER_SITE_OTHER): Payer: BLUE CROSS/BLUE SHIELD | Admitting: Cardiology

## 2017-05-28 VITALS — BP 122/80 | HR 83 | Ht 64.0 in | Wt 189.8 lb

## 2017-05-28 DIAGNOSIS — R002 Palpitations: Secondary | ICD-10-CM

## 2017-05-28 DIAGNOSIS — R0683 Snoring: Secondary | ICD-10-CM

## 2017-05-28 DIAGNOSIS — F1010 Alcohol abuse, uncomplicated: Secondary | ICD-10-CM

## 2017-05-28 DIAGNOSIS — I480 Paroxysmal atrial fibrillation: Secondary | ICD-10-CM

## 2017-05-28 HISTORY — DX: Palpitations: R00.2

## 2017-05-28 HISTORY — DX: Snoring: R06.83

## 2017-05-28 MED ORDER — METOPROLOL SUCCINATE ER 50 MG PO TB24: 50.0000 mg | ORAL_TABLET | Freq: Every day | ORAL | 6 refills | Status: DC

## 2017-05-28 NOTE — Progress Notes (Signed)
Cardiology Consultation:    Date:  05/28/2017   ID:  Allison ConnersUlah J Fiumara, DOB 06/17/1985, MRN [id number]  PCP:  Patient, No Pcp Per  Cardiologist:  Gypsy Balsamobert Khristian Seals, MD   Referring MD: No ref. provider found   Chief Complaint  Patient presents with  . Hospitalization Follow-up    A-fib a year ago  Atrial fibrillation and palpitations  History of Present Illness:    Allison Pope is a 32 y.o. female who is being seen today for the evaluation of atrial fibrillation and palpitations at the request of No ref. provider found.  About a year ago she ended up going to the emergency room because of palpitations she was find to be in atrial fibrillation with fast ventricular rate.  She required electrical cardioversion.  Since that time she reports to have some palpitations.  One type of palpitations that she describes look like some extrasystole she will feel strong time in her chest.  But also she describes some more sustained arrhythmia.  She described palpitations consisting lasting for up to 1 hour those are happening typically every month or every month and a half.  She typically feels weak tired exhausted and somewhat dizzy when she got those episodes but no chest pain no shortness of breath no sweating.  Last time she had it was few weeks ago.  That sensation lasted for 1 hour.  She does not have any exertional chest pain tightness squeezing pressure burning chest. Year ago when she had episode of atrial fibrillation that required cardioversion feeling was that was related to EtOH abuse and dehydration.  However since that time she stopped drinking caffeine, she reduce also amount of drinks that she does.  Still palpitation will happen from time to time. No syncope.  Try to have some atypical chest pain lasting only from split-second the left side of her chest not related to exertion.  Past Medical History:  Diagnosis Date  . Atrial fibrillation (HCC)   . MVP (mitral valve prolapse)     No past  surgical history on file.  Current Medications: Current Meds  Medication Sig  . metoprolol succinate (TOPROL-XL) 50 MG 24 hr tablet Take 1 tablet (50 mg total) by mouth daily. Take with or immediately following a meal.  . [DISCONTINUED] metoprolol succinate (TOPROL-XL) 50 MG 24 hr tablet Take 50 mg by mouth daily. Take with or immediately following a meal.     Allergies:   Patient has no known allergies.   Social History   Socioeconomic History  . Marital status: Single    Spouse name: Not on file  . Number of children: Not on file  . Years of education: Not on file  . Highest education level: Not on file  Social Needs  . Financial resource strain: Not on file  . Food insecurity - worry: Not on file  . Food insecurity - inability: Not on file  . Transportation needs - medical: Not on file  . Transportation needs - non-medical: Not on file  Occupational History  . Not on file  Tobacco Use  . Smoking status: Never Smoker  . Smokeless tobacco: Never Used  Substance and Sexual Activity  . Alcohol use: Yes    Comment: occ, 2-3 per day  . Drug use: No  . Sexual activity: Not on file  Other Topics Concern  . Not on file  Social History Narrative  . Not on file     Family History: The patient's family history includes  Atrial fibrillation in her father; Congestive Heart Failure in her paternal grandmother. ROS:   Please see the history of present illness.    All 14 point review of systems negative except as described per history of present illness.  EKGs/Labs/Other Studies Reviewed:    The following studies were reviewed today: Laboratory tests from a year ago were reviewed.  EKG showed normal sinus rhythm, poor R wave progression anterior precordium.  Nonspecific ST segment changes    Recent Labs: 08/09/2016: Hemoglobin 12.0; Platelets 335 08/11/2016: Magnesium 1.8 08/12/2016: ALT 70; BUN <5; Creatinine, Ser 0.73; Potassium 3.5; Sodium 137  Recent Lipid Panel      Component Value Date/Time   CHOL 176 08/10/2016 0744   TRIG 105 08/10/2016 0744   HDL 74 08/10/2016 0744   CHOLHDL 2.4 08/10/2016 0744   VLDL 21 08/10/2016 0744   LDLCALC 81 08/10/2016 0744    Physical Exam:    VS:  BP 122/80   Pulse 83   Ht 5\' 4"  (1.626 m)   Wt 189 lb 12.8 oz (86.1 kg)   SpO2 96%   BMI 32.58 kg/m     Wt Readings from Last 3 Encounters:  05/28/17 189 lb 12.8 oz (86.1 kg)  08/10/16 207 lb 14.4 oz (94.3 kg)     GEN:  Well nourished, well developed in no acute distress HEENT: Normal NECK: No JVD; No carotid bruits LYMPHATICS: No lymphadenopathy CARDIAC: RRR, no murmurs, no rubs, no gallops RESPIRATORY:  Clear to auscultation without rales, wheezing or rhonchi  ABDOMEN: Soft, non-tender, non-distended MUSCULOSKELETAL:  No edema; No deformity  SKIN: Warm and dry NEUROLOGIC:  Alert and oriented x 3 PSYCHIATRIC:  Normal affect   ASSESSMENT:    1. AF (paroxysmal atrial fibrillation) (HCC)   2. Alcohol abuse   3. Palpitations   4. Snoring    PLAN:    In order of problems listed above:  1. Paroxysmal atrial fibrillation.  So far only one documented episode of atrial fibrillation that required cardioversion.  That happened a year ago.  She still complained of having some palpitations.  I offer her event recorder to try to identify exactly what kind of palpitations with dealing with.  She declined.  However I think we have solution for her with  Lourena Simmonds I suggest to purchase this device and record any palpitation she has.  We also were talking about antiarrhythmic therapy.  She takes metoprolol and seems metoprolol is helping.  Recently metoprolol was restarted previously she could not before the medication because she had no insurance also recently the dose was increased to 50 mg daily and she said since that time she does not have palpitations.  We will watch clinically sure how she does.  In terms of anticoagulation her chads 2 Vascor is 1.  In this situation  anticoagulation is something that need to be considered and I were talking to her about this but she is not interested.  I also think the palpitation she described right now may not be atrial fibrillation.  She tells me is very regular so my thinking is that she may have different type of arrhythmia like supraventricular tachycardia that does not required anticoagulation.  Of course the key will be trying to record palpitation when she has it to see if it is truly atrial fibrillation. 2. She was told 2 years ago she had mitral valve prolapse.  I will ask her to have echocardiogram to check for that.  I do not hear any click  or systolic murmur on my physical examination today but the best way to check this to do echocardiogram.  On top of that with her history of EtOH abuse would be nice to check for left ventricular ejection fraction.  On top of that to assess left atrial size will be helpful as well. 3. Snoring: We initiated conversation about potentially doing sleep study at least pulse oximetry overnight which in my opinion will be beneficial management of this problem if truly exist will be to prevent her from having episodes of atrial fibrillation.  I will see her back in my office in about 1 month echocardiogram will be done she will purchase cardia   Medication Adjustments/Labs and Tests Ordered: Current medicines are reviewed at length with the patient today.  Concerns regarding medicines are outlined above.  Orders Placed This Encounter  Procedures  . EKG 12-Lead  . ECHOCARDIOGRAM COMPLETE   Meds ordered this encounter  Medications  . metoprolol succinate (TOPROL-XL) 50 MG 24 hr tablet    Sig: Take 1 tablet (50 mg total) by mouth daily. Take with or immediately following a meal.    Dispense:  30 tablet    Refill:  6    Signed, Georgeanna Lea, MD, [facility]. 05/28/2017 11:58 AM    Allison Pope

## 2017-05-28 NOTE — Patient Instructions (Signed)
Medication Instructions:  Your physician recommends that you continue on your current medications as directed. Please refer to the Current Medication list given to you today.  Labwork: None ordered  Testing/Procedures: EKG today  Your physician has requested that you have an echocardiogram. Echocardiography is a painless test that uses sound waves to create images of your heart. It provides your doctor with information about the size and shape of your heart and how well your heart's chambers and valves are working. This procedure takes approximately one hour. There are no restrictions for this procedure.  Follow-Up: Your physician recommends that you schedule a follow-up appointment in: 1 month follow up with Dr. Bing MatterKrasowski   Any Other Special Instructions Will Be Listed Below (If Applicable).     If you need a refill on your cardiac medications before your next appointment, please call your pharmacy.

## 2017-06-03 ENCOUNTER — Ambulatory Visit (HOSPITAL_COMMUNITY): Payer: BLUE CROSS/BLUE SHIELD | Attending: Cardiology

## 2017-06-03 ENCOUNTER — Other Ambulatory Visit: Payer: Self-pay

## 2017-06-03 DIAGNOSIS — R002 Palpitations: Secondary | ICD-10-CM | POA: Diagnosis not present

## 2017-06-03 DIAGNOSIS — F1010 Alcohol abuse, uncomplicated: Secondary | ICD-10-CM | POA: Insufficient documentation

## 2017-06-03 DIAGNOSIS — I480 Paroxysmal atrial fibrillation: Secondary | ICD-10-CM | POA: Diagnosis not present

## 2017-06-26 ENCOUNTER — Ambulatory Visit (INDEPENDENT_AMBULATORY_CARE_PROVIDER_SITE_OTHER): Payer: BLUE CROSS/BLUE SHIELD | Admitting: Cardiology

## 2017-06-26 ENCOUNTER — Encounter: Payer: Self-pay | Admitting: Cardiology

## 2017-06-26 VITALS — BP 120/70 | HR 75 | Ht 64.0 in | Wt 188.4 lb

## 2017-06-26 DIAGNOSIS — R002 Palpitations: Secondary | ICD-10-CM

## 2017-06-26 DIAGNOSIS — R0683 Snoring: Secondary | ICD-10-CM

## 2017-06-26 DIAGNOSIS — I480 Paroxysmal atrial fibrillation: Secondary | ICD-10-CM

## 2017-06-26 NOTE — Progress Notes (Signed)
Cardiology Office Note:    Date:  06/26/2017   ID:  Allison Pope, DOB 08/23/1985, MRN [id number]  PCP:  Patient, No Pcp Per  Cardiologist:  Gypsy Balsamobert Krasowski, MD    Referring MD: No ref. provider found   Chief Complaint  Patient presents with  . 1 month follow up  Doing well but described to have one episode of palpitations  History of Present Illness:    Allison Pope is a 32 y.o. female with one documented episode of atrial fibrillation years ago.  She reports to have some palpitations I wanted to put monitor however she declined but she got [place].  He described one episode of palpitation with heart rate of 130 she showed me a rhythm strip for her recording it looks like this was a very regular rhythm very shaky baseline as I can tell for sure what the rhythm was however again regular and definitely not 130 rate was about 80-90.  It did not look like atrial fibrillation.  Past Medical History:  Diagnosis Date  . Atrial fibrillation (HCC)   . MVP (mitral valve prolapse)     No past surgical history on file.  Current Medications: Current Meds  Medication Sig  . metoprolol succinate (TOPROL-XL) 50 MG 24 hr tablet Take 1 tablet (50 mg total) by mouth daily. Take with or immediately following a meal.     Allergies:   Patient has no known allergies.   Social History   Socioeconomic History  . Marital status: Single    Spouse name: None  . Number of children: None  . Years of education: None  . Highest education level: None  Social Needs  . Financial resource strain: None  . Food insecurity - worry: None  . Food insecurity - inability: None  . Transportation needs - medical: None  . Transportation needs - non-medical: None  Occupational History  . None  Tobacco Use  . Smoking status: Never Smoker  . Smokeless tobacco: Never Used  Substance and Sexual Activity  . Alcohol use: Yes    Comment: occ, 2-3 per day  . Drug use: No  . Sexual activity: None  Other Topics  Concern  . None  Social History Narrative  . None     Family History: The patient's family history includes Atrial fibrillation in her father; Congestive Heart Failure in her paternal grandmother. ROS:   Please see the history of present illness.    All 14 point review of systems negative except as described per history of present illness  EKGs/Labs/Other Studies Reviewed:      Recent Labs: 08/09/2016: Hemoglobin 12.0; Platelets 335 08/11/2016: Magnesium 1.8 08/12/2016: ALT 70; BUN <5; Creatinine, Ser 0.73; Potassium 3.5; Sodium 137  Recent Lipid Panel    Component Value Date/Time   CHOL 176 08/10/2016 0744   TRIG 105 08/10/2016 0744   HDL 74 08/10/2016 0744   CHOLHDL 2.4 08/10/2016 0744   VLDL 21 08/10/2016 0744   LDLCALC 81 08/10/2016 0744    Physical Exam:    VS:  BP 120/70   Pulse 75   Ht 5\' 4"  (1.626 m)   Wt 188 lb 6.4 oz (85.5 kg)   SpO2 96%   BMI 32.34 kg/m     Wt Readings from Last 3 Encounters:  06/26/17 188 lb 6.4 oz (85.5 kg)  05/28/17 189 lb 12.8 oz (86.1 kg)  08/10/16 207 lb 14.4 oz (94.3 kg)     GEN:  Well nourished, well developed in  no acute distress HEENT: Normal NECK: No JVD; No carotid bruits LYMPHATICS: No lymphadenopathy CARDIAC: RRR, no murmurs, no rubs, no gallops RESPIRATORY:  Clear to auscultation without rales, wheezing or rhonchi  ABDOMEN: Soft, non-tender, non-distended MUSCULOSKELETAL:  No edema; No deformity  SKIN: Warm and dry LOWER EXTREMITIES: no swelling NEUROLOGIC:  Alert and oriented x 3 PSYCHIATRIC:  Normal affect   ASSESSMENT:    1. AF (paroxysmal atrial fibrillation) (HCC)   2. Palpitations   3. Snoring    PLAN:    In order of problems listed above:  1. Paroxysmal atrial fibrillation: So far only one documented episode.  Chads 2 Vascor equals 1.  She is taking aspirin every single day which I will continue.  She is also small dose of beta-blocker which I will continue.  Keep recording EKG if you have some rhythm  problem and sent to [place]. 2. Snoring I will ask her to have a night oximetry to make sure she does not have any significant sleep apnea. 3. Otherwise she seems to be doing well we will continue present management and see her back in about 4-5 months 4.    Medication Adjustments/Labs and Tests Ordered: Current medicines are reviewed at length with the patient today.  Concerns regarding medicines are outlined above.  No orders of the defined types were placed in this encounter.  Medication changes: No orders of the defined types were placed in this encounter.   Signed, Georgeanna Lea, MD, [facility] 06/26/2017 4:32 PM     [facility]

## 2017-06-26 NOTE — Patient Instructions (Signed)
Medication Instructions:  Your physician recommends that you continue on your current medications as directed. Please refer to the Current Medication list given to you today.   Labwork: None  Testing/Procedures: You will be contacted about overnight pulse oximetry by our office.   Oximetry Oximetry is a technology that measures the oxygen level in the blood. This measurement is taken with a device called an oximeter. This device may also measure the heart rate (pulse). The measurement helps to assess:  A person's oxygen level and breathing.  The need or effectiveness of oxygen therapy or other treatments for lung disease.  A person's ability to tolerate increased activity.  If a more accurate measurement is required, a blood sample will be taken. How is an oximetry reading obtained?  A tape sensor or clip with a light source and a light detector will be placed on an area of the body: ? For children and adults, the sensor is usually placed on areas such as a finger, earlobe, or toe. ? For infants, a tape sensor is usually placed around areas such as the sole of a foot or the palm of a hand.  The device will beam light through the skin and blood. This cannot be felt.  The levels of light received by the detector will be measured and the percentage of blood cells carrying oxygen will be calculated. Oximetry may be used continuously or may be used at specified intervals. Are there any risks associated with oximetry? The risks associated with oximetry are rare. The lights do not burn or hurt your skin. However, there is a risk of skin breakdown if a sensor is left in the same location for long periods of time. What can affect the accuracy of the oximetry reading? Certain factors or conditions can affect the accuracy of the measurements. They include:  Extreme warmth or coolness of the area where the sensor is located.  Excessive sweating of the area where the sensor is  located.  Shivering or too much movement.  Poor blood flow to the area where the sensor is located.  Low hemoglobin or red blood cell levels (anemia).  A bone marrow disease that causes high levels of red blood cells, white blood cells, and platelets (polycythemia vera).  Chronic smoking and recent inhalation of smoke or carbon monoxide.  Bright, artificial lighting.  Nail polish or artificial nails.  Very dark skin.  What is the meaning of the oximetry reading? The normal value depends upon your medical history and your elevation above sea level.  Normal oxygen saturation levels are 90% or higher. Most healthy people have oxygen saturation levels between 95% and 100%.  Low oxygen saturation levels are below 90%. This may happen in people with lung conditions, such as chronic obstructive pulmonary disease (COPD). In this case, supplemental oxygen therapy may be needed.  Summary  Oximetry uses a small device to measure the oxygen level in the blood.  The light in the sensor cannot be felt. The risks associated with oximetry are very rare.  Normal oxygen levels are 90% or higher. Most healthy people have oxygen levels between 95% and 100%.  People with low oxygen levels may need supplemental oxygen. This information is not intended to replace advice given to you by your health care provider. Make sure you discuss any questions you have with your health care provider. Document Released: 06/19/2004 Document Revised: 04/03/2016 Document Reviewed: 04/03/2016 Elsevier Interactive Patient Education  2017 Elsevier Inc.   Follow-Up: Your physician wants you  to follow-up in: 5 months. You will receive a reminder letter in the mail two months in advance. If you don't receive a letter, please call our office to schedule the follow-up appointment.   Any Other Special Instructions Will Be Listed Below (If Applicable).     If you need a refill on your cardiac medications before your  next appointment, please call your pharmacy.

## 2017-07-20 DIAGNOSIS — I480 Paroxysmal atrial fibrillation: Secondary | ICD-10-CM | POA: Diagnosis not present

## 2017-12-07 ENCOUNTER — Other Ambulatory Visit: Payer: Self-pay | Admitting: Cardiology

## 2017-12-07 MED ORDER — METOPROLOL SUCCINATE ER 50 MG PO TB24: 50.0000 mg | ORAL_TABLET | Freq: Every day | ORAL | 6 refills | Status: DC

## 2017-12-07 NOTE — Telephone Encounter (Signed)
Refill sent.

## 2017-12-07 NOTE — Telephone Encounter (Signed)
° ° °  1. Which medications need to be refilled? (please list name of each medication and dose if known) Metoprolol 50mg   2. Which pharmacy/location (including street and city if local pharmacy) is medication to be sent to?[facility] on [address] [place]  3. Do they need a 30 day or 90 day supply? 90 day supply

## 2017-12-07 NOTE — Addendum Note (Signed)
Addended by: Arville CareHUNT, AMANDA N on: 12/07/2017 03:37 PM   Modules accepted: Orders

## 2018-01-06 ENCOUNTER — Ambulatory Visit: Payer: BLUE CROSS/BLUE SHIELD | Admitting: Cardiology

## 2018-01-22 ENCOUNTER — Encounter: Payer: Self-pay | Admitting: Cardiology

## 2018-01-22 ENCOUNTER — Ambulatory Visit (INDEPENDENT_AMBULATORY_CARE_PROVIDER_SITE_OTHER): Payer: BLUE CROSS/BLUE SHIELD | Admitting: Cardiology

## 2018-01-22 VITALS — BP 110/72 | HR 81 | Ht 64.0 in | Wt 198.0 lb

## 2018-01-22 DIAGNOSIS — I480 Paroxysmal atrial fibrillation: Secondary | ICD-10-CM | POA: Diagnosis not present

## 2018-01-22 DIAGNOSIS — R002 Palpitations: Secondary | ICD-10-CM

## 2018-01-22 DIAGNOSIS — R0683 Snoring: Secondary | ICD-10-CM

## 2018-01-22 NOTE — Progress Notes (Signed)
Cardiology Office Note:    Date:  01/22/2018   ID:  Allison Pope, DOB Jul 31, 1985, MRN [id number]  PCP:  Patient, No Pcp Per  Cardiologist:  Gypsy Balsam, MD    Referring MD: No ref. provider found   Chief Complaint  Patient presents with  . Follow-up  Doing for very tired  History of Present Illness:    Allison Pope is a 32 y.o. female with palpitations one episode of atrial fibrillation many years ago.  Overall she complained of working too much she works 2 jobs does not take any day off she said last day of treatment was more than 1/2-year ago.  Described to have episodes of palpitations patient states that she drinks more coffee to stay awake.  On top of that she described to have some palpitations and very stressful days she takes metoprolol which appears to be helping some but again though states that this very stressful her or she does not sleep well all drinks a lot of coffee she will have palpitations.  We did talk in length about stress management.  Also I told her to avoid coffee.  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  Past Medical History:  Diagnosis Date  . Atrial fibrillation (HCC)   . MVP (mitral valve prolapse)     No past surgical history on file.  Current Medications: No outpatient medications have been marked as taking for the 01/22/18 encounter (Office Visit) with Georgeanna Lea, MD.     Allergies:   Patient has no known allergies.   Social History   Socioeconomic History  . Marital status: Single    Spouse name: Not on file  . Number of children: Not on file  . Years of education: Not on file  . Highest education level: Not on file  Occupational History  . Not on file  Social Needs  . Financial resource strain: Not on file  . Food insecurity:    Worry: Not on file    Inability: Not on file  . Transportation needs:    Medical: Not on file    Non-medical: Not on file  Tobacco Use  . Smoking  status: Never Smoker  . Smokeless tobacco: Never Used  Substance and Sexual Activity  . Alcohol use: Yes    Comment: occ, 2-3 per day  . Drug use: No  . Sexual activity: Not on file  Lifestyle  . Physical activity:    Days per week: Not on file    Minutes per session: Not on file  . Stress: Not on file  Relationships  . Social connections:    Talks on phone: Not on file    Gets together: Not on file    Attends religious service: Not on file    Active member of club or organization: Not on file    Attends meetings of clubs or organizations: Not on file    Relationship status: Not on file  Other Topics Concern  . Not on file  Social History Narrative  . Not on file     Family History: The patient's family history includes Atrial fibrillation in her father; Congestive Heart Failure in her paternal grandmother. ROS:   Please see the history of present illness.    All 14 point review of systems negative except as described per history of present illness  EKGs/Labs/Other Studies Reviewed:      Recent Labs: No results found for requested labs  within last 8760 hours.  Recent Lipid Panel    Component Value Date/Time   CHOL 176 08/10/2016 0744   TRIG 105 08/10/2016 0744   HDL 74 08/10/2016 0744   CHOLHDL 2.4 08/10/2016 0744   VLDL 21 08/10/2016 0744   LDLCALC 81 08/10/2016 0744    Physical Exam:    VS:  BP 110/72   Pulse 81   Ht 5\' 4"  (1.626 m)   Wt 198 lb (89.8 kg)   SpO2 96%   BMI 33.99 kg/m     Wt Readings from Last 3 Encounters:  01/22/18 198 lb (89.8 kg)  06/26/17 188 lb 6.4 oz (85.5 kg)  05/28/17 189 lb 12.8 oz (86.1 kg)     GEN:  Well nourished, well developed in no acute distress HEENT: Normal NECK: No JVD; No carotid bruits LYMPHATICS: No lymphadenopathy CARDIAC: RRR, no murmurs, no rubs, no gallops RESPIRATORY:  Clear to auscultation without rales, wheezing or rhonchi  ABDOMEN: Soft, non-tender, non-distended MUSCULOSKELETAL:  No edema; No  deformity  SKIN: Warm and dry LOWER EXTREMITIES: no swelling NEUROLOGIC:  Alert and oriented x 3 PSYCHIATRIC:  Normal affect   ASSESSMENT:    1. AF (paroxysmal atrial fibrillation) (HCC)   2. Palpitations   3. Snoring    PLAN:    In order of problems listed above:  1. Atrial fibrillation only one episode many years ago no recurrences her chads 2 Vascor equals 1 she takes only aspirin the effects of which is somewhat doubtful.  However we will continue for now. 2. Palpitations she takes metoprolol which I will continue for now. 3. Snoring apparently she did have overnight oximetry will look for the results of it.   Medication Adjustments/Labs and Tests Ordered: Current medicines are reviewed at length with the patient today.  Concerns regarding medicines are outlined above.  No orders of the defined types were placed in this encounter.  Medication changes: No orders of the defined types were placed in this encounter.   Signed, Georgeanna Lea, MD, [facility]  01/22/2018 8:58 AM    [facility]

## 2018-01-22 NOTE — Patient Instructions (Signed)
Medication Instructions:  Your physician recommends that you continue on your current medications as directed. Please refer to the Current Medication list given to you today.  If you need a refill on your cardiac medications before your next appointment, please call your pharmacy.   Lab work: None.  If you have labs (blood work) drawn today and your tests are completely normal, you will receive your results only by: . MyChart Message (if you have MyChart) OR . A paper copy in the mail If you have any lab test that is abnormal or we need to change your treatment, we will call you to review the results.  Testing/Procedures: None.   Follow-Up: At [facility], you and your health needs are our priority.  As part of our continuing mission to provide you with exceptional heart care, we have created designated Provider Care Teams.  These Care Teams include your primary Cardiologist (physician) and Advanced Practice Providers (APPs -  Physician Assistants and Nurse Practitioners) who all work together to provide you with the care you need, when you need it. You will need a follow up appointment in 6 months.  Please call our office 2 months in advance to schedule this appointment.  You may see Robert Krasowski, MD or another member of our [facility] Provider Team in [place]: Brian Munley, MD . Rajan Revankar, MD  Any Other Special Instructions Will Be Listed Below (If Applicable).     

## 2018-07-12 ENCOUNTER — Other Ambulatory Visit: Payer: Self-pay | Admitting: Cardiology

## 2018-08-02 ENCOUNTER — Telehealth: Payer: Self-pay | Admitting: Cardiology

## 2018-08-02 NOTE — Telephone Encounter (Signed)
Virtual Visit Pre-Appointment Phone Call  Steps For Call:  1. Confirm consent - "In the setting of the current Covid19 crisis, you are scheduled for a (phone or video) visit with your provider on (date) at (time).  Just as we do with many in-office visits, in order for you to participate in this visit, we must obtain consent.  If you'd like, I can send this to your mychart (if signed up) or email for you to review.  Otherwise, I can obtain your verbal consent now.  All virtual visits are billed to your insurance company just like a normal visit would be.  By agreeing to a virtual visit, we'd like you to understand that the technology does not allow for your provider to perform an examination, and thus may limit your provider's ability to fully assess your condition. If your provider identifies any concerns that need to be evaluated in person, we will make arrangements to do so.  Finally, though the technology is pretty good, we cannot assure that it will always work on either your or our end, and in the setting of a video visit, we may have to convert it to a phone-only visit.  In either situation, we cannot ensure that we have a secure connection.  Are you willing to proceed?" STAFF: Did the patient verbally acknowledge consent to telehealth visit? Document YES/NO here: YES  2. Confirm the BEST phone number to call the day of the visit by including in appointment notes  3. Give patient instructions for WebEx/MyChart download to smartphone as below or Doximity/Doxy.me if video visit (depending on what platform provider is using)  4. Advise patient to be prepared with their blood pressure, heart rate, weight, any heart rhythm information, their current medicines, and a piece of paper and pen handy for any instructions they may receive the day of their visit  5. Inform patient they will receive a phone call 15 minutes prior to their appointment time (may be from unknown caller ID) so they should be  prepared to answer  6. Confirm that appointment type is correct in Epic appointment notes (VIDEO vs PHONE)     TELEPHONE CALL NOTE  Allison Pope has been deemed a candidate for a follow-up tele-health visit to limit community exposure during the Covid-19 pandemic. I spoke with the patient via phone to ensure availability of phone/video source, confirm preferred email & phone number, and discuss instructions and expectations.  I reminded Allison Pope to be prepared with any vital sign and/or heart rhythm information that could potentially be obtained via home monitoring, at the time of her visit. I reminded Allison Pope to expect a phone call at the time of her visit if her visit.  [name] 08/02/2018 11:05 AM   INSTRUCTIONS FOR DOWNLOADING THE WEBEX APP TO SMARTPHONE  - If Apple, ask patient to go to [employer] and type in WebEx in the search bar. Download [employer], the blue/green circle. If Android, go to [employer] and type in [employer] in the search bar. The app is free but as with any other app downloads, their phone may require them to verify saved payment information or Apple/Android password.  - The patient does NOT have to create an account. - On the day of the visit, the assist will walk the patient through joining the meeting with the meeting number/password.  INSTRUCTIONS FOR DOWNLOADING THE MYCHART APP TO SMARTPHONE  - The patient must first make  sure to have activated MyChart and know their login information - If Apple, go to [employer] and type in MyChart in the search bar and download the app. If Android, ask patient to go to [employer] and type in [place] in the search bar and download the app. The app is free but as with any other app downloads, their phone may require them to verify saved payment information or Apple/Android password.  - The patient will need to then log into the app with their MyChart username and password, and select [facility] as their healthcare provider to link the account. When it is time for your visit, go to the MyChart app, find appointments, and click Begin Video Visit. Be sure to Select Allow for your device to access the Microphone and Camera for your visit. You will then be connected, and your provider will be with you shortly.  **If they have any issues connecting, or need assistance please contact MyChart service desk [phone])**  **If using a computer, in order to ensure the best quality for their visit they will need to use either of the following Internet Browsers: [employer], or Google Chrome**  IF USING DOXIMITY or DOXY.ME - The patient will receive a link just prior to their visit, either by text or email (to be determined day of appointment depending on if it's doxy.me or Doximity).     FULL LENGTH CONSENT FOR TELE-HEALTH VISIT   I hereby voluntarily request, consent and authorize [facility] and its employed or contracted physicians, physician assistants, nurse practitioners or other licensed health care professionals (the Practitioner), to provide me with telemedicine health care services (the Services") as deemed necessary by the treating Practitioner. I acknowledge and consent to receive the Services by the Practitioner via telemedicine. I understand that the telemedicine visit will involve communicating with the Practitioner through live audiovisual communication technology and the disclosure of certain medical information by electronic transmission. I acknowledge that I have been given the opportunity to request an in-person assessment or other available alternative prior to the telemedicine visit and am voluntarily participating in the telemedicine visit.  I understand that I have the right to withhold or withdraw my consent to the use of telemedicine in the course of my care at any time, without affecting my right to future care or treatment, and that the  Practitioner or I may terminate the telemedicine visit at any time. I understand that I have the right to inspect all information obtained and/or recorded in the course of the telemedicine visit and may receive copies of available information for a reasonable fee.  I understand that some of the potential risks of receiving the Services via telemedicine include:   Delay or interruption in medical evaluation due to technological equipment failure or disruption;  Information transmitted may not be sufficient (e.g. poor resolution of images) to allow for appropriate medical decision making by the Practitioner; and/or   In rare instances, security protocols could fail, causing a breach of personal health information.  Furthermore, I acknowledge that it is my responsibility to provide information about my medical history, conditions and care that is complete and accurate to the best of my ability. I acknowledge that Practitioner's advice, recommendations, and/or decision may be based on factors not within their control, such as incomplete or inaccurate data provided by me or distortions of diagnostic images or specimens that may result from electronic transmissions. I understand that the practice of medicine is not an exact  science and that Practitioner makes no warranties or guarantees regarding treatment outcomes. I acknowledge that I will receive a copy of this consent concurrently upon execution via email to the email address I last provided but may also request a printed copy by calling the office of [place].    I understand that my insurance will be billed for this visit.   I have read or had this consent read to me.  I understand the contents of this consent, which adequately explains the benefits and risks of the Services being provided via telemedicine.   I have been provided ample opportunity to ask questions regarding this consent and the Services and have had my questions answered to my  satisfaction.  I give my informed consent for the services to be provided through the use of telemedicine in my medical care  By participating in this telemedicine visit I agree to the above.

## 2018-08-11 ENCOUNTER — Telehealth: Payer: Self-pay | Admitting: *Deleted

## 2018-08-11 ENCOUNTER — Other Ambulatory Visit: Payer: Self-pay

## 2018-08-11 ENCOUNTER — Encounter: Payer: Self-pay | Admitting: Cardiology

## 2018-08-11 ENCOUNTER — Telehealth (INDEPENDENT_AMBULATORY_CARE_PROVIDER_SITE_OTHER): Payer: BLUE CROSS/BLUE SHIELD | Admitting: Cardiology

## 2018-08-11 DIAGNOSIS — R55 Syncope and collapse: Secondary | ICD-10-CM

## 2018-08-11 DIAGNOSIS — I480 Paroxysmal atrial fibrillation: Secondary | ICD-10-CM

## 2018-08-11 DIAGNOSIS — R002 Palpitations: Secondary | ICD-10-CM

## 2018-08-11 HISTORY — DX: Syncope and collapse: R55

## 2018-08-11 NOTE — Telephone Encounter (Signed)
Called pt to inquire about insurance. Monitor was ordered but there is no insurance listed. Pt will call back when gets home and can find card.

## 2018-08-11 NOTE — Patient Instructions (Signed)
Medication Instructions:  Your physician recommends that you continue on your current medications as directed. Please refer to the Current Medication list given to you today.   If you need a refill on your cardiac medications before your next appointment, please call your pharmacy.   Lab work: Your physician recommends that you return for lab work in: BMP, Magnesium  If you have labs (blood work) drawn today and your tests are completely normal, you will receive your results only by: Marland Kitchen MyChart Message (if you have MyChart) OR . A paper copy in the mail If you have any lab test that is abnormal or we need to change your treatment, we will call you to review the results.  Testing/Procedures: Your physician has recommended that you wear a holter monitor. Holter monitors are medical devices that record the heart's electrical activity. Doctors most often use these monitors to diagnose arrhythmias. Arrhythmias are problems with the speed or rhythm of the heartbeat. The monitor is a small, portable device. You can wear one while you do your normal daily activities. This is usually used to diagnose what is causing palpitations/syncope (passing out).    Follow-Up: At [facility], you and your health needs are our priority.  As part of our continuing mission to provide you with exceptional heart care, we have created designated Provider Care Teams.  These Care Teams include your primary Cardiologist (physician) and Advanced Practice Providers (APPs -  Physician Assistants and Nurse Practitioners) who all work together to provide you with the care you need, when you need it. You will need a follow up appointment in 1 months.   Any Other Special Instructions Will Be Listed Below (If Applicable).

## 2018-08-11 NOTE — Progress Notes (Signed)
Virtual Visit via Video Note   This visit type was conducted due to national recommendations for restrictions regarding the COVID-19 Pandemic (e.g. social distancing) in an effort to limit this patient's exposure and mitigate transmission in our community.  Due to her co-morbid illnesses, this patient is at least at moderate risk for complications without adequate follow up.  This format is felt to be most appropriate for this patient at this time.  All issues noted in this document were discussed and addressed.  A limited physical exam was performed with this format.  Please refer to the patient's chart for her consent to telehealth for [facility].  Evaluation Performed:  Follow-up visit  This visit type was conducted due to national recommendations for restrictions regarding the COVID-19 Pandemic (e.g. social distancing).  This format is felt to be most appropriate for this patient at this time.  All issues noted in this document were discussed and addressed.  No physical exam was performed (except for noted visual exam findings with Video Visits).  Please refer to the patient's chart (MyChart message for video visits and phone note for telephone visits) for the patient's consent to telehealth for [facility].  Date:  08/11/2018  ID: Allison Pope, DOB 03/05/1986, MRN [id number]   Patient Location: [address] [place] [address]   Provider location:   [facility] [place] [facility]  PCP:  Patient, No Pcp Per  Cardiologist:  Gypsy Balsam, MD     Chief Complaint: I passed out yesterday  History of Present Illness:    Allison Pope is a 33 y.o. female who presents via audio/video conferencing for a telehealth visit today.  History of one documented episode of paroxysmal atrial fibrillation requiring cardioversion.  After that she was managed for some palpitations with metoprolol succinate 50 mg daily apparently yesterday she had episodes when she started having palpitations  her heart was speeding up she did not feel well she drove to her job she went she said that the dose started working and then for a moment she passed out she started hearing some strange noises and then she woke up the moment she hit her head on the desk.  She did not sustain injury after that she sit at the desk leaning against the wall for few minutes and then she was able to get up she still feel her heart flip flopping in the going fast.  Overall she described to kind of types of palpitations 1 gently 1 as she described lasting for few minutes to maybe 2 hours that does not make her feel better in another episodes and this is the second episode that she had yesterday when she feels really poor and actually again yesterday she got brief episode of what appears to be syncope.  Denies having any chest pain during this situation but described to have tingling in her fingers that happened without any provocation also some atypical short sharp chest pain lasting for few seconds not related to exertion.  I did review information we have about her so far include echocardiogram which did not show any significant abnormalities      The patient does not have symptoms concerning for COVID-19 infection (fever, chills, cough, or new SHORTNESS OF BREATH).    Prior CV studies:   The following studies were reviewed today:       Past Medical History:  Diagnosis Date  . Atrial fibrillation (HCC)   . MVP (mitral valve prolapse)  No past surgical history on file.   Current Meds  Medication Sig  . metoprolol succinate (TOPROL-XL) 50 MG 24 hr tablet TAKE 1 TABLET BY MOUTH DAILY. TAKE WITH OR IMMEDIATELY FOLLOWING A MEAL.      Family History: The patient's family history includes Atrial fibrillation in her father; Congestive Heart Failure in her paternal grandmother.   ROS:   Please see the history of present illness.     All other systems reviewed and are negative.   Labs/Other Tests and Data  Reviewed:     Recent Labs: No results found for requested labs within last 8760 hours.  Recent Lipid Panel    Component Value Date/Time   CHOL 176 08/10/2016 0744   TRIG 105 08/10/2016 0744   HDL 74 08/10/2016 0744   CHOLHDL 2.4 08/10/2016 0744   VLDL 21 08/10/2016 0744   LDLCALC 81 08/10/2016 0744      Exam:    Vital Signs:  There were no vitals taken for this visit.    Wt Readings from Last 3 Encounters:  01/22/18 198 lb (89.8 kg)  06/26/17 188 lb 6.4 oz (85.5 kg)  05/28/17 189 lb 12.8 oz (86.1 kg)     Well nourished, well developed in no acute distress. Alert awake and at the time 3.  I talked to her on the video link.  She is not in any distress  Diagnosis for this visit:   1. AF (paroxysmal atrial fibrillation) (HCC)   2. Palpitations   3. Syncope and collapse      ASSESSMENT & PLAN:    1.  Paroxysmal atrial fibrillation her chads 2 vascular equals only 1 she is not anticoagulated.  Did have episode yesterday which is very worrisome the potential explanation for sudden syncope is tachybradycardia phenomenon however after episode of syncope she still felt her heart going very fast.  I think we must put monitor on her trend for exactly what kind of arrhythmia she is dealing with.  She may be requiring referral to EP however.  Will also check her Chem-7 to make sure she is not hypokalemic like she used to be. 2.  Episode of syncope.  Details as described above.  Echocardiogram normal will put monitor on.  We talked about implantable loop recorder she is very reluctant.  I talked also to her about using her cardia she does have the device when she was trying to record it when she was having palpitation she was simply too shaky and there was no quality recording I told her to see if she can recorded just simply pressing the device to her chest.  She will try to do it and hopefully When she had episode she would be able to record the rhythm.  COVID-19 Education: The signs  and symptoms of COVID-19 were discussed with the patient and how to seek care for testing (follow up with PCP or arrange E-visit).  The importance of social distancing was discussed today.  Patient Risk:   After full review of this patients clinical status, I feel that they are at least moderate risk at this time.  Time:   Today, I have spent 21 minutes with the patient with telehealth technology discussing pt health issues.  I spent 5 minutes reviewing her chart before the visit.  Visit was finished at 1:26 PM.    Medication Adjustments/Labs and Tests Ordered: Current medicines are reviewed at length with the patient today.  Concerns regarding medicines are outlined above.  No orders  of the defined types were placed in this encounter.  Medication changes: No orders of the defined types were placed in this encounter.    Disposition: Follow-up in 1 month.  14 days monitor will be sent to her.  Chem-7 will be done  Signed, Georgeanna Lea, MD, [facility] 08/11/2018 1:28 PM    [facility]

## 2018-08-12 ENCOUNTER — Telehealth: Payer: Self-pay | Admitting: *Deleted

## 2018-08-12 NOTE — Telephone Encounter (Signed)
Spoke with pt and got correct insurance in [place] and will enroll pt to have 14 day Zio sent to her.

## 2018-08-13 DIAGNOSIS — R55 Syncope and collapse: Secondary | ICD-10-CM | POA: Diagnosis not present

## 2018-08-13 DIAGNOSIS — I480 Paroxysmal atrial fibrillation: Secondary | ICD-10-CM | POA: Diagnosis not present

## 2018-08-13 DIAGNOSIS — R002 Palpitations: Secondary | ICD-10-CM | POA: Diagnosis not present

## 2018-08-14 LAB — BASIC METABOLIC PANEL
BUN/Creatinine Ratio: 16 (ref 9–23)
BUN: 11 mg/dL (ref 6–20)
CO2: 20 mmol/L (ref 20–29)
Calcium: 8.9 mg/dL (ref 8.7–10.2)
Chloride: 101 mmol/L (ref 96–106)
Creatinine, Ser: 0.69 mg/dL (ref 0.57–1.00)
GFR calc Af Amer: 131 mL/min/{1.73_m2} (ref >59)
GFR calc non Af Amer: 114 mL/min/{1.73_m2} (ref >59)
Glucose: 73 mg/dL (ref 65–99)
Potassium: 3.5 mmol/L (ref 3.5–5.2)
Sodium: 141 mmol/L (ref 134–144)

## 2018-08-14 LAB — MAGNESIUM: Magnesium: 1.6 mg/dL (ref 1.6–2.3)

## 2018-08-19 ENCOUNTER — Telehealth: Payer: Self-pay

## 2018-08-19 NOTE — Telephone Encounter (Signed)
Tried to contact patient about her labs. The phone rang once and went to voicemail. Voicemail is full at this time so a message could not be left. Will try to contact the patient at another time.

## 2018-08-23 ENCOUNTER — Telehealth: Payer: Self-pay | Admitting: *Deleted

## 2018-08-23 NOTE — Telephone Encounter (Signed)
We can type te note saying that because of her condition sheneeds office visits and sometimes she ay be absent from work

## 2018-08-23 NOTE — Telephone Encounter (Signed)
Pt would like a note for work stating that she went to [facility] to get blood work done per Dr. Bing Matter. She had blood drawn on Friday, May 1st. Pt would also like a note stating she has atrial fibrillation and misses work for this from time to time. Please advise.

## 2018-08-23 NOTE — Telephone Encounter (Signed)
Called patient back and advised patient to continue with her Allison Pope. I advised to monitor if she has any episodes of Afib with it. She stated that she has had 6-7 episodes in the past 2 weeks according to her Allison Pope. I advised that RJK may want these to be sent to him, and to continue to monitor. She agreed and understands I will probably give her a call tomorrow on how to get them to [facility].

## 2018-08-23 NOTE — Telephone Encounter (Signed)
Keep monitoring with Lourena Simmonds and see if she has episodes of A fibrilation

## 2018-08-23 NOTE — Telephone Encounter (Signed)
Patient called back and was given the results of her labs. She is wondering what the next step is. She states she had called about pricing on the monitor. The cheapest was going to be $500 which she could not afford. She has been having episodes and has been trying to catch it on her Cardia.

## 2018-08-24 ENCOUNTER — Encounter: Payer: Self-pay | Admitting: Emergency Medicine

## 2018-08-24 NOTE — Progress Notes (Signed)
Called patient informed her that we can have letter faxed to [place] office, she agrees this will work and she will pick up when able.

## 2018-08-24 NOTE — Telephone Encounter (Signed)
Letter generated. Will have Dr. Tomie China sign since he is DOD this week and will have it faxed to [facility] office. Patient aware it will be available for her to pick up in [place] when she gets the chance.

## 2018-10-05 ENCOUNTER — Telehealth: Payer: BLUE CROSS/BLUE SHIELD | Admitting: Cardiology

## 2018-10-06 ENCOUNTER — Telehealth (INDEPENDENT_AMBULATORY_CARE_PROVIDER_SITE_OTHER): Payer: BC Managed Care – PPO | Admitting: Cardiology

## 2018-10-06 ENCOUNTER — Other Ambulatory Visit: Payer: Self-pay

## 2018-10-06 ENCOUNTER — Encounter: Payer: Self-pay | Admitting: Cardiology

## 2018-10-06 ENCOUNTER — Telehealth: Payer: Self-pay

## 2018-10-06 DIAGNOSIS — I480 Paroxysmal atrial fibrillation: Secondary | ICD-10-CM

## 2018-10-06 DIAGNOSIS — R002 Palpitations: Secondary | ICD-10-CM

## 2018-10-06 DIAGNOSIS — R0683 Snoring: Secondary | ICD-10-CM | POA: Diagnosis not present

## 2018-10-06 DIAGNOSIS — R55 Syncope and collapse: Secondary | ICD-10-CM

## 2018-10-06 MED ORDER — METOPROLOL SUCCINATE ER 50 MG PO TB24: ORAL_TABLET | ORAL | 1 refills | Status: DC

## 2018-10-06 NOTE — Progress Notes (Signed)
Virtual Visit via Telephone Note   This visit type was conducted due to national recommendations for restrictions regarding the COVID-19 Pandemic (e.g. social distancing) in an effort to limit this patient's exposure and mitigate transmission in our community.  Due to her co-morbid illnesses, this patient is at least at moderate risk for complications without adequate follow up.  This format is felt to be most appropriate for this patient at this time.  The patient did not have access to video technology/had technical difficulties with video requiring transitioning to audio format only (telephone).  All issues noted in this document were discussed and addressed.  No physical exam could be performed with this format.  Please refer to the patient's chart for her  consent to telehealth [facility].  Evaluation Performed:  Follow-up visit  This visit type was conducted due to national recommendations for restrictions regarding the COVID-19 Pandemic (e.g. social distancing).  This format is felt to be most appropriate for this patient at this time.  All issues noted in this document were discussed and addressed.  No physical exam was performed (except for noted visual exam findings with Video Visits).  Please refer to the patient's chart (MyChart message for video visits and phone note for telephone visits) for the patient's consent to telehealth [facility].  Date:  10/06/2018  ID: Allison ConnersUlah J Awe, DOB 11/25/1985, MRN [id number]   Patient Location: [address] [place] [id number]   Provider location[facility]   [facility] [place] [facility]  PCP:  Patient, No Pcp Per  Cardiologist:  Gypsy Balsamobert Blain Hunsucker, MD     Chief Complaint: Doing well  History of Present Illness:    Allison Pope is a 33 y.o. female  who presents via audio/video conferencing for a telehealth visit today.  I have seen her initially because of paroxysmal atrial fibrillation.  Her chads 2 vascular equals 1 because of being  a woman she is not anticoagulated.  Also last time of seeing her after he had episode of syncope.  So far work-up has been negative.  We sent event recorder for her to wear however she refused because she find out she have to pay $500 for it.  She does have cardiac device and asked her to record EKG if she got any palpitations and sent it to me second check and see what the rhythm is.  Overall she says she got much less stress at home and her palpitations improved significantly very rare and nonsustained   The patient does not have symptoms concerning for COVID-19 infection (fever, chills, cough, or new SHORTNESS OF BREATH).    Prior CV studies:   The following studies were reviewed today:       Past Medical History:  Diagnosis Date  . Atrial fibrillation (HCC)   . MVP (mitral valve prolapse)     No past surgical history on file.   Current Meds  Medication Sig  . metoprolol succinate (TOPROL-XL) 50 MG 24 hr tablet TAKE 1 TABLET BY MOUTH DAILY. TAKE WITH OR IMMEDIATELY FOLLOWING A MEAL.      Family History: The patient's family history includes Atrial fibrillation in her father; Congestive Heart Failure in her paternal grandmother.   ROS:   Please see the history of present illness.     All other systems reviewed and are negative.   Labs/Other Tests and Data Reviewed:     Recent Labs: 08/13/2018: BUN 11; Creatinine, Ser 0.69; Magnesium 1.6; Potassium 3.5; Sodium 141  Recent Lipid Panel    Component Value Date/Time   CHOL 176 08/10/2016 0744   TRIG 105 08/10/2016 0744   HDL 74 08/10/2016 0744   CHOLHDL 2.4 08/10/2016 0744   VLDL 21 08/10/2016 0744   LDLCALC 81 08/10/2016 0744      Exam:    Vital Signs:  There were no vitals taken for this visit.    Wt Readings from Last 3 Encounters:  01/22/18 198 lb (89.8 kg)  06/26/17 188 lb 6.4 oz (85.5 kg)  05/28/17 189 lb 12.8 oz (86.1 kg)     Well nourished, well developed in no acute distress. Alert oriented x3  initially we got video link however there was no audio therefore we have to switch to phone conversation.  She was actually walking to her car in the parking lot when I spoke to her.  Then she stepped in the car.  Overall not in any distress and doing well  Diagnosis for this visit:   1. AF (paroxysmal atrial fibrillation) ([place])   2. Syncope and collapse   3. Palpitations   4. Snoring      ASSESSMENT & PLAN:    1.  Paroxysmal atrial fibrillation rare episodes chads 2 vascular only 1 for a woman therefore she is not anticoagulated.  Plan as outlined above she was an EKG of any recording that will be abnormal. 2.  Syncope and collapse denies having any since then.  She does not want to pursue any work-up for this for now 3.  Palpitations much better after improvement of stressful situation at home.  We will maintain her on beta-blocker 4.  Snoring she apparently did have overnight oximetry which was normal.  I do not have documentation of it  however  COVID-19 Education: The signs and symptoms of COVID-19 were discussed with the patient and how to seek care for testing (follow up with PCP or arrange E-visit).  The importance of social distancing was discussed today.  Patient Risk:   After full review of this patients clinical status, I feel that they are at least moderate risk at this time.  Time:   Today, I have spent 15 minutes with the patient with telehealth technology discussing pt health issues.  I spent 5 minutes reviewing her chart before the visit.  Visit was finished at 4:18 PM.    Medication Adjustments/Labs and Tests Ordered: Current medicines are reviewed at length with the patient today.  Concerns regarding medicines are outlined above.  No orders of the defined types were placed in this encounter.  Medication changes: No orders of the defined types were placed in this encounter.    Disposition: Follow-up in 5 months  Signed, Park Liter, MD, [facility]  10/06/2018 [place]

## 2018-10-06 NOTE — Patient Instructions (Signed)
Medication Instructions:  Your physician recommends that you continue on your current medications as directed. Please refer to the Current Medication list given to you today.  If you need a refill on your cardiac medications before your next appointment, please call your pharmacy.   Lab work: None.  If you have labs (blood work) drawn today and your tests are completely normal, you will receive your results only by: . MyChart Message (if you have MyChart) OR . A paper copy in the mail If you have any lab test that is abnormal or we need to change your treatment, we will call you to review the results.  Testing/Procedures: None.    Follow-Up: At [facility], you and your health needs are our priority.  As part of our continuing mission to provide you with exceptional heart care, we have created designated Provider Care Teams.  These Care Teams include your primary Cardiologist (physician) and Advanced Practice Providers (APPs -  Physician Assistants and Nurse Practitioners) who all work together to provide you with the care you need, when you need it. You will need a follow up appointment in 5 months.  Please call our office 2 months in advance to schedule this appointment.  You may see Robert Krasowski, MD or another member of our [facility] Provider Team in [place]: Brian Munley, MD . Rajan Revankar, MD  Any Other Special Instructions Will Be Listed Below (If Applicable).     

## 2018-10-06 NOTE — Addendum Note (Signed)
Addended by: Ashok Norris on: 10/06/2018 04:32 PM   Modules accepted: Orders

## 2018-10-06 NOTE — Telephone Encounter (Signed)
Patient stated that she had decided she didn't want to get a monitor due to cost, but it still came. She has not put it on, I did let her know to mail it in. She states she received a bill from [facility] and is wondering if she has to pay for it?

## 2018-10-11 NOTE — Telephone Encounter (Signed)
Called [employer] to cancel this monitor. Reference number for call is [id number].  Spoke with pt who has not mailed monitor back yet and let her know it has been cancelled. Also let her know the bill she got would not include cost of monitor because they do not bill until monitor is returned and processed. Pt thanked me for the call and said that helped a lot.

## 2018-10-22 ENCOUNTER — Encounter (HOSPITAL_COMMUNITY): Payer: Self-pay | Admitting: *Deleted

## 2018-10-22 ENCOUNTER — Encounter (HOSPITAL_COMMUNITY): Payer: Self-pay

## 2018-10-22 ENCOUNTER — Other Ambulatory Visit: Payer: Self-pay

## 2018-10-22 ENCOUNTER — Ambulatory Visit (INDEPENDENT_AMBULATORY_CARE_PROVIDER_SITE_OTHER)
Admission: EM | Admit: 2018-10-22 | Discharge: 2018-10-22 | Disposition: A | Discharge status: Home / Self Care | Payer: BC Managed Care – PPO | Source: Home / Self Care | Attending: Internal Medicine | Admitting: Internal Medicine

## 2018-10-22 ENCOUNTER — Emergency Department (HOSPITAL_COMMUNITY): Payer: BC Managed Care – PPO

## 2018-10-22 ENCOUNTER — Emergency Department (HOSPITAL_COMMUNITY)
Admission: EM | Admit: 2018-10-22 | Discharge: 2018-10-22 | Disposition: A | Discharge status: Home / Self Care | Payer: BC Managed Care – PPO | Attending: Emergency Medicine | Admitting: Emergency Medicine

## 2018-10-22 DIAGNOSIS — R112 Nausea with vomiting, unspecified: Secondary | ICD-10-CM

## 2018-10-22 DIAGNOSIS — I4891 Unspecified atrial fibrillation: Secondary | ICD-10-CM | POA: Insufficient documentation

## 2018-10-22 DIAGNOSIS — R197 Diarrhea, unspecified: Secondary | ICD-10-CM

## 2018-10-22 DIAGNOSIS — Z20822 Contact with and (suspected) exposure to covid-19: Secondary | ICD-10-CM

## 2018-10-22 DIAGNOSIS — R1013 Epigastric pain: Secondary | ICD-10-CM | POA: Insufficient documentation

## 2018-10-22 DIAGNOSIS — R6889 Other general symptoms and signs: Secondary | ICD-10-CM

## 2018-10-22 DIAGNOSIS — R1110 Vomiting, unspecified: Secondary | ICD-10-CM | POA: Diagnosis not present

## 2018-10-22 DIAGNOSIS — K529 Noninfective gastroenteritis and colitis, unspecified: Secondary | ICD-10-CM

## 2018-10-22 LAB — I-STAT BETA HCG BLOOD, ED (MC, WL, AP ONLY): I-stat hCG, quantitative: <5 m[IU]/mL (ref <5)

## 2018-10-22 LAB — CBC
HCT: 42.6 % (ref 36.0–46.0)
Hemoglobin: 14.8 g/dL (ref 12.0–15.0)
MCH: 36.4 pg — ABNORMAL HIGH (ref 26.0–34.0)
MCHC: 34.7 g/dL (ref 30.0–36.0)
MCV: 104.7 fL — ABNORMAL HIGH (ref 80.0–100.0)
Platelets: 286 10*3/uL (ref 150–400)
RBC: 4.07 MIL/uL (ref 3.87–5.11)
RDW: 11.9 % (ref 11.5–15.5)
WBC: 5 10*3/uL (ref 4.0–10.5)
nRBC: 0 % (ref 0.0–0.2)

## 2018-10-22 LAB — HEPATIC FUNCTION PANEL
ALT: 50 U/L — ABNORMAL HIGH (ref 0–44)
AST: 111 U/L — ABNORMAL HIGH (ref 15–41)
Albumin: 4.6 g/dL (ref 3.5–5.0)
Alkaline Phosphatase: 60 U/L (ref 38–126)
Bilirubin, Direct: 0.5 mg/dL — ABNORMAL HIGH (ref 0.0–0.2)
Indirect Bilirubin: 1.7 mg/dL — ABNORMAL HIGH (ref 0.3–0.9)
Total Bilirubin: 2.2 mg/dL — ABNORMAL HIGH (ref 0.3–1.2)
Total Protein: 8.5 g/dL — ABNORMAL HIGH (ref 6.5–8.1)

## 2018-10-22 LAB — URINALYSIS, ROUTINE W REFLEX MICROSCOPIC
Bilirubin Urine: NEGATIVE
Glucose, UA: NEGATIVE mg/dL
Hgb urine dipstick: NEGATIVE
Ketones, ur: 80 mg/dL — AB
Leukocytes,Ua: NEGATIVE
Nitrite: NEGATIVE
Protein, ur: 30 mg/dL — AB
Specific Gravity, Urine: 1.02 (ref 1.005–1.030)
pH: 6 (ref 5.0–8.0)

## 2018-10-22 LAB — BASIC METABOLIC PANEL
Anion gap: 26 — ABNORMAL HIGH (ref 5–15)
Anion gap: 14 (ref 5–15)
BUN: 6 mg/dL (ref 6–20)
BUN: 6 mg/dL (ref 6–20)
CO2: 13 mmol/L — ABNORMAL LOW (ref 22–32)
CO2: 19 mmol/L — ABNORMAL LOW (ref 22–32)
Calcium: 9.1 mg/dL (ref 8.9–10.3)
Calcium: 7.5 mg/dL — ABNORMAL LOW (ref 8.9–10.3)
Chloride: 100 mmol/L (ref 98–111)
Chloride: 105 mmol/L (ref 98–111)
Creatinine, Ser: 0.81 mg/dL (ref 0.44–1.00)
Creatinine, Ser: 0.79 mg/dL (ref 0.44–1.00)
GFR calc Af Amer: >60 mL/min (ref >60)
GFR calc Af Amer: >60 mL/min (ref >60)
GFR calc non Af Amer: >60 mL/min (ref >60)
GFR calc non Af Amer: >60 mL/min (ref >60)
Glucose, Bld: 99 mg/dL (ref 70–99)
Glucose, Bld: 104 mg/dL — ABNORMAL HIGH (ref 70–99)
Potassium: 3.5 mmol/L (ref 3.5–5.1)
Potassium: 3.1 mmol/L — ABNORMAL LOW (ref 3.5–5.1)
Sodium: 139 mmol/L (ref 135–145)
Sodium: 138 mmol/L (ref 135–145)

## 2018-10-22 LAB — LIPASE, BLOOD: Lipase: 121 U/L — ABNORMAL HIGH (ref 11–51)

## 2018-10-22 MED ORDER — SODIUM CHLORIDE 0.9 % IV BOLUS
1000.0000 mL | Freq: Once | INTRAVENOUS | Status: AC
  Administered 2018-10-22: 1000 mL via INTRAVENOUS

## 2018-10-22 MED ORDER — SODIUM CHLORIDE 0.9% FLUSH
3.0000 mL | Freq: Once | INTRAVENOUS | Status: AC
  Administered 2018-10-22: 3 mL via INTRAVENOUS

## 2018-10-22 NOTE — ED Triage Notes (Signed)
Patient presents to Urgent Care with complaints of abdominal pain, vomiting, diarrhea, and a cough with shortness of breath since last night. Patient reports she had a bout of a-fib with her vomiting and that is when her shortness of breath started.

## 2018-10-22 NOTE — ED Notes (Signed)
Pt given ginger ale.

## 2018-10-22 NOTE — ED Notes (Signed)
Patient verbalizes understanding of discharge instructions. Opportunity for questioning and answers were provided. Armband removed by staff, pt discharged from ED.  

## 2018-10-22 NOTE — Discharge Instructions (Signed)
You were seen in the ED today after being in A fib with RVR at Urgent Care; you have not been in a fib while in the ED today. Your labwork was reassuring in terms of your abdominal pain, nausea, vomiting, and diarrhea - this is likely due to a virus of some kind. Please increase your fluid intake in the next couple of days. Eat bland foods including bananas, rice, applesauce, and toast. This will help ease your stomach. Follow up with a PCP as needed.   Please follow up with your cardiologist regarding your a fib today.

## 2018-10-22 NOTE — ED Notes (Signed)
Main lab to add on HFP 

## 2018-10-22 NOTE — ED Provider Notes (Signed)
[facility]    CSN: [id number] Arrival date & time: 10/22/18  1213      History   Chief Complaint Chief Complaint  Patient presents with  . Abdominal Pain  . Emesis  . Cough    HPI Allison Pope is a 33 y.o. female comes to urgent care today with complaints of abdominal pain, nausea, vomiting and diarrhea.  Patient started experiencing cough with some shortness of breath last night.  Was associated with headache as well.  She comes in today with the above-mentioned complaints.  She says her symptoms have been worsening overnight.  She complains of dizziness and lightheadedness.  No relieving factors.  She has had several bouts of vomiting and diarrhea.  Vomit is nonbloody/nonbilious.  Diarrhea is nonbloody.  No chest pain or chest pressure.  She has a history of paroxysmal atrial fibrillation on metoprolol.   HPI  Past Medical History:  Diagnosis Date  . Atrial fibrillation ([place])   . MVP (mitral valve prolapse)     Patient Active Problem List   Diagnosis Date Noted  . Syncope and collapse 08/11/2018  . Palpitations 05/28/2017  . Snoring 05/28/2017  . Alcohol abuse 08/12/2016  . Acute pancreatitis without infection or necrosis 08/09/2016  . Hypokalemia 08/09/2016  . AF (paroxysmal atrial fibrillation) ([place]) 08/09/2016  . Routine health maintenance 07/04/2013    History reviewed. No pertinent surgical history.  OB History   No obstetric history on file.      Home Medications    Prior to Admission medications   Medication Sig Start Date End Date Taking? Authorizing Provider  metoprolol succinate (TOPROL-XL) 50 MG 24 hr tablet TAKE 1 TABLET BY MOUTH DAILY. TAKE WITH OR IMMEDIATELY FOLLOWING A MEAL. 10/06/18   Park Liter, MD    Family History Family History  Problem Relation Age of Onset  . Atrial fibrillation Father   . Congestive Heart Failure Paternal Grandmother   . Healthy Mother     Social History Social History   Tobacco Use  .  Smoking status: Never Smoker  . Smokeless tobacco: Never Used  Substance Use Topics  . Alcohol use: Yes    Comment: occ, 2-3 per day  . Drug use: No     Allergies   Patient has no known allergies.   Review of Systems Review of Systems  Constitutional: Positive for activity change, chills and fatigue. Negative for fever.  HENT: Negative for congestion, ear pain, postnasal drip and sore throat.   Respiratory: Positive for cough and shortness of breath. Negative for chest tightness and wheezing.   Cardiovascular: Negative for chest pain and palpitations.  Gastrointestinal: Positive for abdominal pain, diarrhea, nausea and vomiting.  Genitourinary: Negative for dysuria, frequency, urgency and vaginal discharge.  Skin: Negative.   Neurological: Positive for dizziness, weakness and light-headedness.  Psychiatric/Behavioral: Negative for confusion and decreased concentration.     Physical Exam Triage Vital Signs ED Triage Vitals  Enc Vitals Group     BP 10/22/18 1249 (!) 175/126     Pulse Rate 10/22/18 1249 (!) 122     Resp 10/22/18 1249 19     Temp 10/22/18 1249 99.2 F (37.3 C)     Temp Source 10/22/18 1249 Oral     SpO2 10/22/18 1249 98 %     Weight --      Height --      Head Circumference --      Peak Flow --      Pain Score  10/22/18 1247 4     Pain Loc --      Pain Edu? --      Excl. in GC? --    No data found.  Updated Vital Signs BP (!) 175/126 (BP Location: Left Arm)   Pulse (!) 122 Comment: a-fib  Temp 99.2 F (37.3 C) (Oral)   Resp 19   SpO2 98%   Visual Acuity Right Eye Distance:   Left Eye Distance:   Bilateral Distance:    Right Eye Near:   Left Eye Near:    Bilateral Near:     Physical Exam Constitutional:      General: She is in acute distress.     Appearance: She is ill-appearing and toxic-appearing.  HENT:     Mouth/Throat:     Pharynx: No pharyngeal swelling or oropharyngeal exudate.  Cardiovascular:     Rate and Rhythm:  Tachycardia present. Rhythm irregular.     Heart sounds: Normal heart sounds. No murmur. No friction rub.  Pulmonary:     Effort: Pulmonary effort is normal. No respiratory distress.     Breath sounds: Normal breath sounds. No wheezing or rales.  Abdominal:     General: Abdomen is flat. There is no distension or abdominal bruit. There are no signs of injury.     Palpations: Abdomen is soft.     Tenderness: There is no abdominal tenderness.     Hernia: No hernia is present.  Skin:    General: Skin is warm.     Capillary Refill: Capillary refill takes less than 2 seconds.     Coloration: Skin is not cyanotic or mottled.     Findings: No erythema.  Neurological:     General: No focal deficit present.     Mental Status: She is alert and oriented to person, place, and time.  Psychiatric:        Mood and Affect: Mood normal.        Behavior: Behavior normal.      UC Treatments / Results  Labs (all labs ordered are listed, but only abnormal results are displayed) Labs Reviewed - No data to display  EKG   Radiology No results found.  Procedures Procedures (including critical care time)  Medications Ordered in UC Medications - No data to display  Initial Impression / Assessment and Plan / UC Course  I have reviewed the triage vital signs and the nursing notes.  Pertinent labs & imaging results that were available during my care of the patient were reviewed by me and considered in my medical decision making (see chart for details).     1.  Atrial fibrillation with rapid ventricular response: Patient is currently symptomatic for A. fib with RVR EKG confirms A. fib with RVR with current heart rate of 143 Patient is advised to go to emergency department for further evaluation and management  2.  Nausea, vomiting and diarrhea-suspected COVID-19 infection: Patient will need fluid resuscitation COVID-19 testing Anticipate inpatient stay if heart rate control was not achieved  in the emergency department. Final Clinical Impressions(s) / UC Diagnoses   Final diagnoses:  Atrial fibrillation with RVR (HCC)  Suspected Covid-19 Virus Infection   Discharge Instructions   None    ED Prescriptions    None     Controlled Substance Prescriptions [place] Controlled Substance Registry consulted? No   Merrilee JanskyLamptey, Treshon Stannard O, MD 10/22/18 [phone]

## 2018-10-22 NOTE — ED Triage Notes (Signed)
To ED for eval of afib rvr. Sent to ED from [facility]. States she started vomiting last pm which 'normally sends me into afib'. On ED ECG pt found in NSR.

## 2018-10-22 NOTE — ED Provider Notes (Signed)
[facility]   CSN: [id number] Arrival date & time: 10/22/18  1311    History   Chief Complaint Chief Complaint  Patient presents with  . Shortness of Breath  . Emesis    HPI Allison Pope is a 33 y.o. female with hx Afib rate controlled on Metoprolol and MVP who presents to the ED today complaining of epigastric abdominal pain, nausea, nonbloody nonbilious emesis, and diarrhea that began last night. Pt reports when she woke up this AM she felt short of breath and knew that she was in A fib, prompting her to go to the Urgent Care. While there pt was found to be in A fib with RVR and sent to the ED for further evaluation. While in triage patient had an EKG done with normal sinus rhythm. She reports she no longer feels short of breath. 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 No fever or chills. Pt reports she ate chicken salad last night; no one else in the house at the same thing. No recent abx use. Occasional EtOH usage; hx of pancreatitis.        Past Medical History:  Diagnosis Date  . Atrial fibrillation (HCC)   . MVP (mitral valve prolapse)     Patient Active Problem List   Diagnosis Date Noted  . Syncope and collapse 08/11/2018  . Palpitations 05/28/2017  . Snoring 05/28/2017  . Alcohol abuse 08/12/2016  . Acute pancreatitis without infection or necrosis 08/09/2016  . Hypokalemia 08/09/2016  . AF (paroxysmal atrial fibrillation) (HCC) 08/09/2016  . Routine health maintenance 07/04/2013    History reviewed. No pertinent surgical history.   OB History   No obstetric history on file.      Home Medications    Prior to Admission medications    Medication Sig Start Date End Date Taking? Authorizing Provider  metoprolol succinate (TOPROL-XL) 50 MG 24 hr tablet TAKE 1 TABLET BY MOUTH DAILY. TAKE WITH OR IMMEDIATELY FOLLOWING A MEAL. 10/06/18   Georgeanna LeaKrasowski, Robert J, MD    Family History Family History  Problem Relation Age of Onset  . Atrial fibrillation Father   . Congestive Heart Failure Paternal Grandmother   . Healthy Mother     Social History Social History   Tobacco Use  . Smoking status: Never Smoker  . Smokeless tobacco: Never Used  Substance Use Topics  . Alcohol use: Yes    Comment: occ, 2-3 per day  . Drug use: No     Allergies   Patient has no known allergies.   Review of Systems Review of Systems  Constitutional: Negative for chills and fever.  HENT: Negative for congestion.   Eyes: Negative for visual disturbance.  Respiratory: Positive for cough (chronic) and shortness of breath (resolved).   Cardiovascular: Negative for chest pain.  Gastrointestinal: Positive for abdominal pain, diarrhea, nausea and vomiting. Negative for blood in stool and constipation.  Genitourinary: Negative for dysuria, flank pain, frequency, pelvic pain and vaginal pain.  Musculoskeletal: Negative for myalgias.  Skin: Negative for rash.  Neurological: Negative for headaches.     Physical Exam Updated Vital Signs BP (!) 157/109   Pulse 86   Temp  98.7 F (37.1 C) (Oral)   Resp 19   SpO2 97%   Physical Exam Vitals signs and nursing note reviewed.  Constitutional:      Appearance: She is not ill-appearing.  HENT:     Head: Normocephalic and atraumatic.  Eyes:     Conjunctiva/sclera: Conjunctivae normal.  Neck:     Musculoskeletal: Neck supple.  Cardiovascular:     Rate and Rhythm: Normal rate and regular rhythm.  Pulmonary:     Effort: Pulmonary effort is normal.     Breath sounds: Normal breath sounds. No decreased breath sounds, wheezing, rhonchi or rales.  Abdominal:     Palpations: Abdomen is soft.      Tenderness: There is abdominal tenderness.     Comments: Minimal epigastric tenderness to palpation; no rebound or guarding; negative Murphy's sign  Skin:    General: Skin is warm and dry.  Neurological:     Mental Status: She is alert.      ED Treatments / Results  Labs (all labs ordered are listed, but only abnormal results are displayed) Labs Reviewed  BASIC METABOLIC PANEL - Abnormal; Notable for the following components:      Result Value   CO2 13 (*)    Anion gap 26 (*)    All other components within normal limits  CBC - Abnormal; Notable for the following components:   MCV 104.7 (*)    MCH 36.4 (*)    All other components within normal limits  LIPASE, BLOOD - Abnormal; Notable for the following components:   Lipase 121 (*)    All other components within normal limits  URINALYSIS, ROUTINE W REFLEX MICROSCOPIC - Abnormal; Notable for the following components:   Ketones, ur 80 (*)    Protein, ur 30 (*)    Bacteria, UA RARE (*)    All other components within normal limits  HEPATIC FUNCTION PANEL - Abnormal; Notable for the following components:   Total Protein 8.5 (*)    AST 111 (*)    ALT 50 (*)    Total Bilirubin 2.2 (*)    Bilirubin, Direct 0.5 (*)    Indirect Bilirubin 1.7 (*)    All other components within normal limits  BASIC METABOLIC PANEL - Abnormal; Notable for the following components:   Potassium 3.1 (*)    CO2 19 (*)    Glucose, Bld 104 (*)    Calcium 7.5 (*)    All other components within normal limits  I-STAT BETA HCG BLOOD, ED ([facility], [facility], [facility] ONLY)    EKG None  Radiology Dg Chest 2 View  Result Date: 10/22/2018 CLINICAL DATA:  Atrial fibrillation with rapid ventricular response. Vomiting. EXAM: CHEST - 2 VIEW COMPARISON:  Radiographs 08/09/2016. FINDINGS: The heart size and mediastinal contours are normal. The lungs are clear. There is no pleural effusion or pneumothorax. No acute osseous findings are identified. IMPRESSION: Stable chest.  No  active cardiopulmonary process. Electronically Signed   By: Carey BullocksWilliam  Veazey M.D.   On: 10/22/2018 14:32    Procedures Procedures (including critical care time)  Medications Ordered in ED Medications  sodium chloride flush (NS) 0.9 % injection 3 mL (3 mLs Intravenous Given 10/22/18 1603)  sodium chloride 0.9 % bolus 1,000 mL (0 mLs Intravenous Stopped 10/22/18 1800)  sodium chloride 0.9 % bolus 1,000 mL (0 mLs Intravenous Stopped 10/22/18 1947)     Initial Impression / Assessment and Plan / ED Course  I have reviewed the triage vital signs and the  nursing notes.  Pertinent labs & imaging results that were available during my care of the patient were reviewed by me and considered in my medical decision making (see chart for details).    33 year old female presenting to the ED today with abdominal pain, nausea, vomiting, diarrhea, shortness of breath. Found to be in Afib with RVR while at urgent care this AM; currently on metoprolol for rate control. While in the ED reverted back to NSR on her own; shortness of breath has resolved. Pt reports she has hx of paroxysmal a fib. Last saw her cardiologist about 1 month ago via a telemedicine call. Regarding abdominal pain - hx of pancreatitis; states she only occasionally drinks EtOH now. Pain is mostly localized to the epigastrium; no hx of gallstones. Mildly tender to epigastrium; no peritoneal signs; do not feel patient needs CT imaging today. Will check hepatic function as this was not done in triage; if elevated will consider RUQ ultrasound. Labwork obtained prior to being seen - lipase mildly elevated at 121. No white blood cell count. CO2 initially 13 with an anion gap of 26; suspect dehydration. Given 2 L NS bolus in the ED and BMP rechecked; CO2 increaeed to 19 and anion gap stabilized at 14. Pt initially with normal K but at 3.1 with repeat; likely from excessive fluids. Encouraged leafy greens and bananas at home. Resources given for PCP follow up  as pt does not have one; advised to follow up with her cardiologist regarding a fib today. Pt is in agreement with plan today. She is stable for discharge home.       Final Clinical Impressions(s) / ED Diagnoses   Final diagnoses:  Nausea vomiting and diarrhea  Gastroenteritis  Atrial fibrillation, transient [facility])    ED Discharge Orders    None       Eustaquio Maize, PA-C 10/22/18 2154    Daleen Bo, MD 10/25/18 1420

## 2018-10-22 NOTE — ED Notes (Signed)
Patient is being discharged from the Urgent [place] and sent to the Emergency Department via wheelchair by staff. Per Dr. Lanny Cramp, patient is stable but in need of higher level of care due to symptomatic atrial fibrillation with rapid ventricular response. Patient is aware and verbalizes understanding of plan of care.  Vitals:   10/22/18 1249  BP: (!) 175/126  Pulse: (!) 122  Resp: 19  Temp: 99.2 F (37.3 C)  SpO2: 98%

## 2019-03-09 ENCOUNTER — Ambulatory Visit: Payer: BC Managed Care – PPO | Admitting: Cardiology

## 2019-08-31 ENCOUNTER — Other Ambulatory Visit: Payer: Self-pay

## 2019-08-31 ENCOUNTER — Ambulatory Visit (INDEPENDENT_AMBULATORY_CARE_PROVIDER_SITE_OTHER): Payer: BC Managed Care – PPO | Admitting: Cardiology

## 2019-08-31 ENCOUNTER — Encounter: Payer: Self-pay | Admitting: Cardiology

## 2019-08-31 VITALS — BP 150/100 | HR 80 | Ht 64.0 in | Wt 176.0 lb

## 2019-08-31 DIAGNOSIS — R002 Palpitations: Secondary | ICD-10-CM

## 2019-08-31 DIAGNOSIS — I480 Paroxysmal atrial fibrillation: Secondary | ICD-10-CM | POA: Diagnosis not present

## 2019-08-31 DIAGNOSIS — R55 Syncope and collapse: Secondary | ICD-10-CM

## 2019-08-31 MED ORDER — METOPROLOL SUCCINATE ER 50 MG PO TB24: ORAL_TABLET | ORAL | 1 refills | Status: DC

## 2019-08-31 NOTE — Patient Instructions (Signed)

## 2019-08-31 NOTE — Progress Notes (Signed)
Cardiology Office Note:    Date:  08/31/2019   ID:  Allison Pope, DOB 12-Mar-1986, MRN [id number]  PCP:  Patient, No Pcp Per  Cardiologist:  Gypsy Balsam, MD    Referring MD: No ref. provider found   Chief Complaint  Patient presents with  . Follow-up  Had 2 episode of palpitations  History of Present Illness:    Allison Pope is a 34 y.o. female with paroxysmal atrial fibrillation.  Her chads 2 vascular equals 1 for being a woman, therefore she is not anticoagulated.  Since have seen her last time she described few episode of atrial fibrillation.  She described the fact that she had difficulty sleeping.  She does not use much cardiomyopathy.  But overall she says she sleeps about 4 hours at night.  There is no chest pain tightness squeezing pressure burning chest no dizziness no passing out.  Episode of atrial fibrillation last between few minutes to up to a few hours.  Past Medical History:  Diagnosis Date  . Acute pancreatitis without infection or necrosis 08/09/2016  . AF (paroxysmal atrial fibrillation) (HCC) 08/09/2016  . Alcohol abuse 08/12/2016  . Atrial fibrillation (HCC)   . Hypokalemia 08/09/2016  . MVP (mitral valve prolapse)   . Palpitations 05/28/2017  . Routine health maintenance 07/04/2013  . Snoring 05/28/2017  . Syncope and collapse 08/11/2018    History reviewed. No pertinent surgical history.  Current Medications: No outpatient medications have been marked as taking for the 08/31/19 encounter (Office Visit) with Georgeanna Lea, MD.     Allergies:   Patient has no known allergies.   Social History   Socioeconomic History  . Marital status: Single    Spouse name: Not on file  . Number of children: Not on file  . Years of education: Not on file  . Highest education level: Not on file  Occupational History  . Not on file  Tobacco Use  . Smoking status: Never Smoker  . Smokeless tobacco: Never Used  Substance and Sexual Activity  . Alcohol use: Yes    Comment: occ, 2-3 per day  . Drug use: No  . Sexual activity: Not on file  Other Topics Concern  . Not on file  Social History Narrative  . Not on file   Social Determinants of Health   Financial Resource Strain:   . Difficulty of Paying Living Expenses:   Food Insecurity:   . Worried About Programme researcher, broadcasting/film/video in the Last Year:   . Barista in the Last Year:   Transportation Needs:   . Freight forwarder (Medical):   Marland Kitchen Lack of Transportation (Non-Medical):   Physical Activity:   . Days of Exercise per Week:   . Minutes of Exercise per Session:   Stress:   . Feeling of Stress :   Social Connections:   . Frequency of Communication with Friends and Family:   . Frequency of Social Gatherings with Friends and Family:   . Attends Religious Services:   . Active Member of Clubs or Organizations:   . Attends Banker Meetings:   Marland Kitchen Marital Status:      Family History: The patient's family history includes Atrial fibrillation in her father; Congestive Heart Failure in her paternal grandmother; Healthy in her mother. ROS:   Please see the history of present illness.    All 14 point review of systems negative except as described per history of present illness  EKGs/Labs/Other  Studies Reviewed:      Recent Labs: 10/22/2018: ALT 50; BUN 6; Creatinine, Ser 0.79; Hemoglobin 14.8; Platelets 286; Potassium 3.1; Sodium 138  Recent Lipid Panel    Component Value Date/Time   CHOL 176 08/10/2016 0744   TRIG 105 08/10/2016 0744   HDL 74 08/10/2016 0744   CHOLHDL 2.4 08/10/2016 0744   VLDL 21 08/10/2016 0744   LDLCALC 81 08/10/2016 0744    Physical Exam:    VS:  BP (!) 150/100   Pulse 80   Ht 5\' 4"  (1.626 m)   Wt 176 lb (79.8 kg)   SpO2 97%   BMI 30.21 kg/m     Wt Readings from Last 3 Encounters:  08/31/19 176 lb (79.8 kg)  01/22/18 198 lb (89.8 kg)  06/26/17 188 lb 6.4 oz (85.5 kg)     GEN:  Well nourished, well developed in no acute  distress HEENT: Normal NECK: No JVD; No carotid bruits LYMPHATICS: No lymphadenopathy CARDIAC: RRR, no murmurs, no rubs, no gallops RESPIRATORY:  Clear to auscultation without rales, wheezing or rhonchi  ABDOMEN: Soft, non-tender, non-distended MUSCULOSKELETAL:  No edema; No deformity  SKIN: Warm and dry LOWER EXTREMITIES: no swelling NEUROLOGIC:  Alert and oriented x 3 PSYCHIATRIC:  Normal affect   ASSESSMENT:    1. AF (paroxysmal atrial fibrillation) (HCC)   2. Palpitations   3. Syncope and collapse    PLAN:    In order of problems listed above:  1. Paroxysmal atrial fibrillation.  She does have some episode of palpitations.  We were talking about need to take metoprolol which she will do.  I told her if those episodes become more frequent let me know and then will be able to address this more aggressively which will probably involve wearing monitor to see exactly what arrhythmia she is experiencing. 2. Palpitations: Most likely atrial fibrillation but she does not want to wear monitor. 3. Syncope and collapse: Denies.   Medication Adjustments/Labs and Tests Ordered: Current medicines are reviewed at length with the patient today.  Concerns regarding medicines are outlined above.  Orders Placed This Encounter  Procedures  . Lipid Profile  . EKG 12-Lead   Medication changes:  Meds ordered this encounter  Medications  . metoprolol succinate (TOPROL-XL) 50 MG 24 hr tablet    Sig: TAKE 1 TABLET BY MOUTH DAILY. TAKE WITH OR IMMEDIATELY FOLLOWING A MEAL.    Dispense:  90 tablet    Refill:  1    Signed, Park Liter, MD, [facility] 08/31/2019 4:51 PM    [place]

## 2019-09-01 ENCOUNTER — Telehealth: Payer: Self-pay

## 2019-09-01 LAB — LIPID PANEL
Chol/HDL Ratio: 4.6 ratio — ABNORMAL HIGH (ref 0.0–4.4)
Cholesterol, Total: 203 mg/dL — ABNORMAL HIGH (ref 100–199)
HDL: 44 mg/dL (ref >39)
LDL Chol Calc (NIH): 144 mg/dL — ABNORMAL HIGH (ref 0–99)
Triglycerides: 80 mg/dL (ref 0–149)
VLDL Cholesterol Cal: 15 mg/dL (ref 5–40)

## 2019-09-01 NOTE — Telephone Encounter (Signed)
Left message on patients voicemail to please return our call.   

## 2019-09-01 NOTE — Telephone Encounter (Signed)
-----   Message from Georgeanna Lea, MD sent at 09/01/2019  1:13 PM EDT ----- Cholesterol elevated, but at her age recommendation is diet and exercise and recheck in about 6 months

## 2019-10-12 ENCOUNTER — Telehealth: Payer: Self-pay | Admitting: Cardiology

## 2019-10-12 NOTE — Telephone Encounter (Signed)
New Message:   Pt said she went to the dentist this morning and was told her blood pressure was high. It was 156/112 pulse 80  After sitting for 20 minutes, it was 155/118 pulse 78. She was told to contact her Cardiologist.

## 2019-10-13 NOTE — Telephone Encounter (Signed)
Left message for patient to return call.

## 2019-10-13 NOTE — Telephone Encounter (Signed)
Asked her to check blood pressure at home for next few days and see what blood pressure will be.  She may require some extra medication for high blood pressure like amlodipine but first ask her to check blood pressure for next few days at different times of the day and send results to [place] so we can decide about therapy.

## 2019-10-13 NOTE — Telephone Encounter (Signed)
Follow up ° ° °Patient is returning your call. Please call. ° ° ° °

## 2019-10-13 NOTE — Telephone Encounter (Signed)
Called patient back informed her to monitor blood pressure for a few days and call us back with readings so we can determine if she needs to be started on any medication. She verbally understood no further questions.

## 2019-10-21 NOTE — Telephone Encounter (Signed)
Left message for patient to return call.

## 2019-11-08 NOTE — Telephone Encounter (Signed)
Called patient. She is going to keep a log and then call us and let us know. She hasn't been checking but she is going to and call us in a week. No further questions.

## 2019-12-15 ENCOUNTER — Other Ambulatory Visit: Payer: Self-pay | Admitting: Cardiology

## 2019-12-15 MED ORDER — METOPROLOL SUCCINATE ER 50 MG PO TB24: ORAL_TABLET | ORAL | 1 refills | Status: DC

## 2019-12-15 NOTE — Telephone Encounter (Signed)
*  STAT* If patient is at the pharmacy, call can be transferred to refill team.   1. Which medications need to be refilled? (please list name of each medication and dose if known) metoprolol succinate (TOPROL-XL) 50 MG 24 hr tablet  2. Which pharmacy/location (including street and city if local pharmacy) is medication to be sent to? [id number] - [place], [place] - [address] AT [facility] [address]  3. Do they need a 30 day or 90 day supply? 90 day supply

## 2019-12-15 NOTE — Telephone Encounter (Signed)
Refill sent in per request.  

## 2020-01-19 IMAGING — CR CHEST - 2 VIEW
2 series · 2 of 2 positions shown · non-contrast
Comparison: Radiographs 08/09/2016.

CLINICAL DATA: Atrial fibrillation with rapid ventricular response.
Vomiting.

EXAM:
CHEST - 2 VIEW

[chest pa]
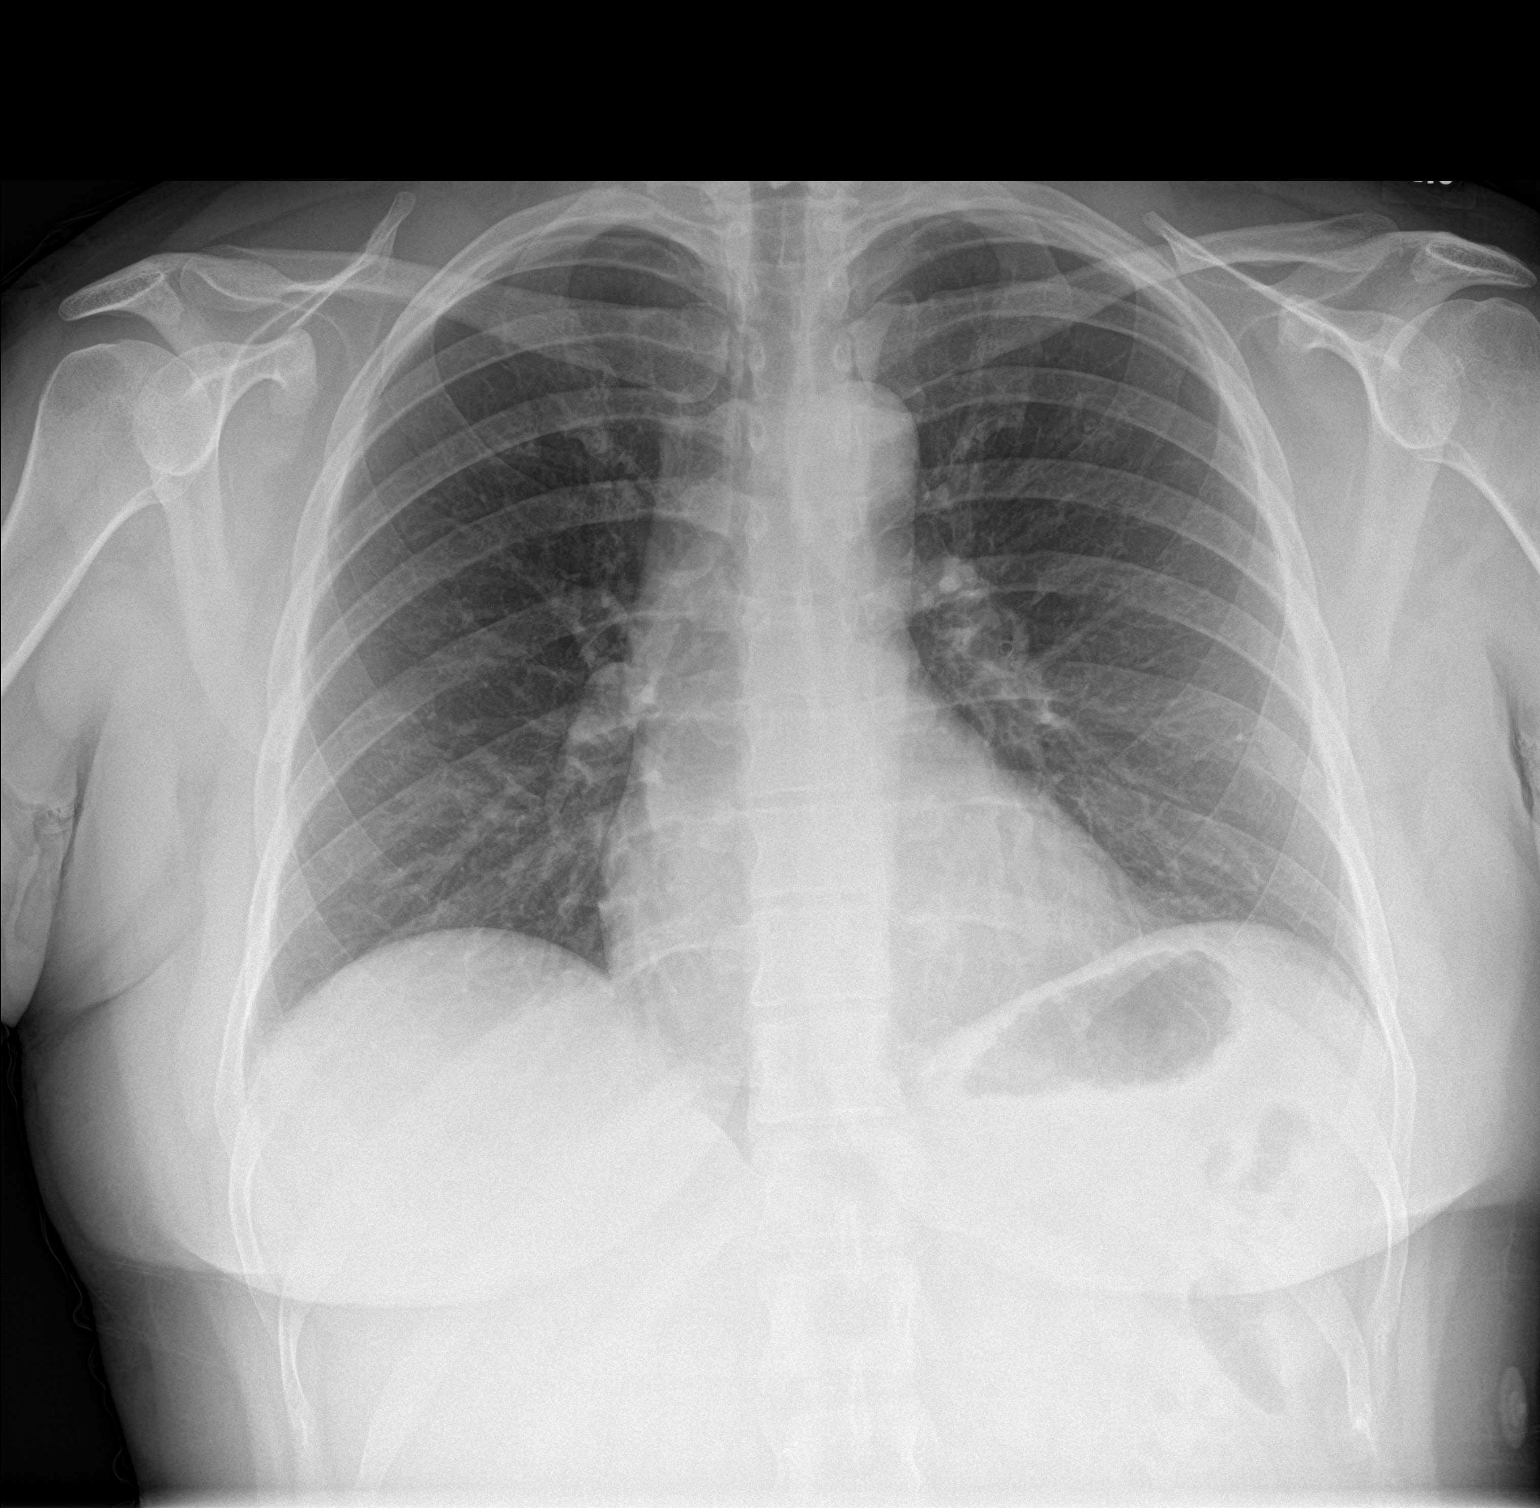

[chest lat]
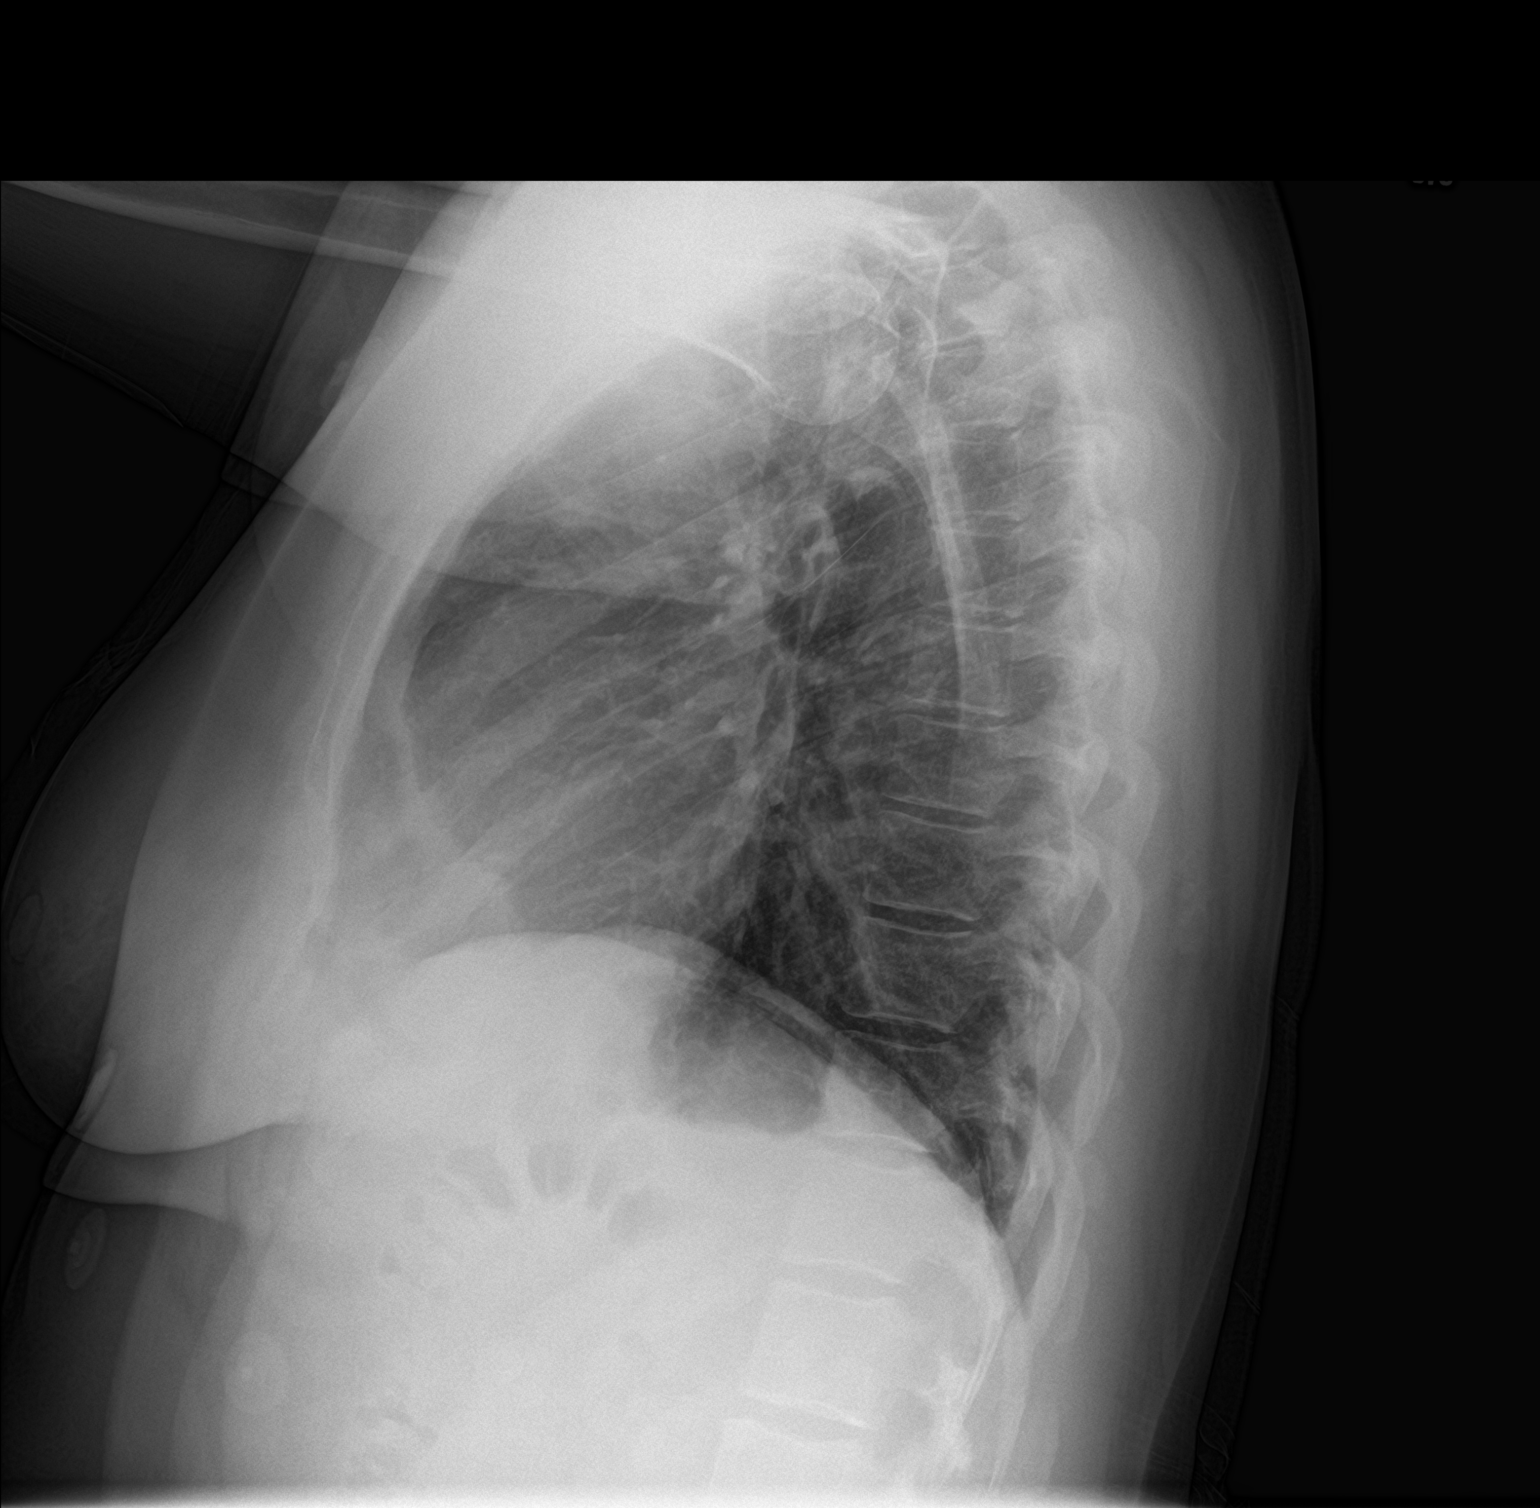

[2 of 2 positions shown; findings below may reference images not displayed]

FINDINGS: The heart size and mediastinal contours are normal. The lungs are
clear. There is no pleural effusion or pneumothorax. No acute
osseous findings are identified.
IMPRESSION: Stable chest.  No active cardiopulmonary process.

## 2020-03-05 DIAGNOSIS — I4891 Unspecified atrial fibrillation: Secondary | ICD-10-CM | POA: Insufficient documentation

## 2020-03-05 DIAGNOSIS — I341 Nonrheumatic mitral (valve) prolapse: Secondary | ICD-10-CM | POA: Insufficient documentation

## 2020-03-06 ENCOUNTER — Ambulatory Visit (INDEPENDENT_AMBULATORY_CARE_PROVIDER_SITE_OTHER): Payer: BC Managed Care – PPO | Admitting: Cardiology

## 2020-03-06 ENCOUNTER — Other Ambulatory Visit: Payer: Self-pay

## 2020-03-06 ENCOUNTER — Encounter: Payer: Self-pay | Admitting: Cardiology

## 2020-03-06 VITALS — BP 180/118 | HR 80 | Ht 64.0 in | Wt 191.0 lb

## 2020-03-06 DIAGNOSIS — R0683 Snoring: Secondary | ICD-10-CM

## 2020-03-06 DIAGNOSIS — R002 Palpitations: Secondary | ICD-10-CM

## 2020-03-06 DIAGNOSIS — I480 Paroxysmal atrial fibrillation: Secondary | ICD-10-CM | POA: Diagnosis not present

## 2020-03-06 MED ORDER — LOSARTAN POTASSIUM 25 MG PO TABS: 25.0000 mg | ORAL_TABLET | Freq: Every day | ORAL | 1 refills | Status: DC

## 2020-03-06 NOTE — Addendum Note (Signed)
Addended by: Hazle Quant on: 03/06/2020 04:35 PM   Modules accepted: Orders

## 2020-03-06 NOTE — Progress Notes (Signed)
Cardiology Office Note:    Date:  03/06/2020   ID:  Allison Pope, DOB 1985/07/21, MRN [id number]  PCP:  Patient, No Pcp Per  Cardiologist:  Gypsy Balsam, MD    Referring MD: No ref. provider found   Chief Complaint  Patient presents with  . Follow-up  Doing fine only had one episode of atrial fibrillation since I seen her last time  History of Present Illness:    Allison Pope is a 34 y.o. female with past medical history significant for paroxysmal atrial fibrillation, very rare episodes, she was not anticoagulated since she had chads 2 Vascor equals 0 however today we have looked like new diagnosis with her blood pressure being elevated so if we truly think this hypertension is persistent we talking about CHA2DS2-VASc 2 and anticoagulation need to be considered.  For now we will try to consider not managing her blood pressure and see how much medication she will required.  And then will decide about anticoagulation.  Past Medical History:  Diagnosis Date  . Acute pancreatitis without infection or necrosis 08/09/2016  . AF (paroxysmal atrial fibrillation) (HCC) 08/09/2016  . Alcohol abuse 08/12/2016  . Atrial fibrillation (HCC)   . Hypokalemia 08/09/2016  . MVP (mitral valve prolapse)   . Palpitations 05/28/2017  . Routine health maintenance 07/04/2013  . Snoring 05/28/2017  . Syncope and collapse 08/11/2018    No past surgical history on file.  Current Medications: Current Meds  Medication Sig  . metoprolol succinate (TOPROL-XL) 50 MG 24 hr tablet TAKE 1 TABLET BY MOUTH DAILY. TAKE WITH OR IMMEDIATELY FOLLOWING A MEAL.     Allergies:   Patient has no known allergies.   Social History   Socioeconomic History  . Marital status: Single    Spouse name: Not on file  . Number of children: Not on file  . Years of education: Not on file  . Highest education level: Not on file  Occupational History  . Not on file  Tobacco Use  . Smoking status: Never Smoker  . Smokeless  tobacco: Never Used  Vaping Use  . Vaping Use: Never used  Substance and Sexual Activity  . Alcohol use: Yes    Comment: occ, 2-3 per day  . Drug use: No  . Sexual activity: Not on file  Other Topics Concern  . Not on file  Social History Narrative  . Not on file   Social Determinants of Health   Financial Resource Strain:   . Difficulty of Paying Living Expenses: Not on file  Food Insecurity:   . Worried About Programme researcher, broadcasting/film/video in the Last Year: Not on file  . Ran Out of Food in the Last Year: Not on file  Transportation Needs:   . Lack of Transportation (Medical): Not on file  . Lack of Transportation (Non-Medical): Not on file  Physical Activity:   . Days of Exercise per Week: Not on file  . Minutes of Exercise per Session: Not on file  Stress:   . Feeling of Stress : Not on file  Social Connections:   . Frequency of Communication with Friends and Family: Not on file  . Frequency of Social Gatherings with Friends and Family: Not on file  . Attends Religious Services: Not on file  . Active Member of Clubs or Organizations: Not on file  . Attends Banker Meetings: Not on file  . Marital Status: Not on file     Family History: The patient's  family history includes Atrial fibrillation in her father; Congestive Heart Failure in her paternal grandmother; Healthy in her mother. ROS:   Please see the history of present illness.    All 14 point review of systems negative except as described per history of present illness  EKGs/Labs/Other Studies Reviewed:      Recent Labs: No results found for requested labs within last 8760 hours.  Recent Lipid Panel    Component Value Date/Time   CHOL 203 (H) 08/31/2019 1656   TRIG 80 08/31/2019 1656   HDL 44 08/31/2019 1656   CHOLHDL 4.6 (H) 08/31/2019 1656   CHOLHDL 2.4 08/10/2016 0744   VLDL 21 08/10/2016 0744   LDLCALC 144 (H) 08/31/2019 1656    Physical Exam:    VS:  BP (!) 180/118 (BP Location: Left Arm,  Patient Position: Sitting, Cuff Size: Normal)   Pulse 80   Ht 5\' 4"  (1.626 m)   Wt 191 lb (86.6 kg)   SpO2 97%   BMI 32.79 kg/m     Wt Readings from Last 3 Encounters:  03/06/20 191 lb (86.6 kg)  08/31/19 176 lb (79.8 kg)  01/22/18 198 lb (89.8 kg)     GEN:  Well nourished, well developed in no acute distress HEENT: Normal NECK: No JVD; No carotid bruits LYMPHATICS: No lymphadenopathy CARDIAC: RRR, no murmurs, no rubs, no gallops RESPIRATORY:  Clear to auscultation without rales, wheezing or rhonchi  ABDOMEN: Soft, non-tender, non-distended MUSCULOSKELETAL:  No edema; No deformity  SKIN: Warm and dry LOWER EXTREMITIES: no swelling NEUROLOGIC:  Alert and oriented x 3 PSYCHIATRIC:  Normal affect   ASSESSMENT:    1. AF (paroxysmal atrial fibrillation) (HCC)   2. Palpitations   3. Snoring    PLAN:    In order of problems listed above:  1. Atrial fibrillation paroxysmal 1 episode since I seen him last time discussion as above she may require anticoagulation since she does have diagnosis of hypertension.  We will initiate therapy with ARB and then Chem-7 will be checked.  Would likely concern is the fact that she does have low potassium which may indicate hyperaldosteronism. 2. Palpitations related to atrial fibrillation plan as outlined above. 3. Snoring apparently sleep apnea test negative. 4. Dyslipidemia her LDL is 144 HDL 44, we talked about eating a diet with a low cholesterol.   Medication Adjustments/Labs and Tests Ordered: Current medicines are reviewed at length with the patient today.  Concerns regarding medicines are outlined above.  No orders of the defined types were placed in this encounter.  Medication changes: No orders of the defined types were placed in this encounter.   Signed, 03/24/18, MD, [facility] 03/06/2020 4:30 PM    [facility]

## 2020-03-06 NOTE — Patient Instructions (Signed)
Medication Instructions:  Your physician has recommended you make the following change in your medication:   START: Losartan 25 mg daily   *If you need a refill on your cardiac medications before your next appointment, please call your pharmacy*   Lab Work: Your physician recommends that you return for lab work in 1 week: bmp   If you have labs (blood work) drawn today and your tests are completely normal, you will receive your results only by: Marland Kitchen MyChart Message (if you have MyChart) OR . A paper copy in the mail If you have any lab test that is abnormal or we need to change your treatment, we will call you to review the results.   Testing/Procedures: None   Follow-Up: At [facility], you and your health needs are our priority.  As part of our continuing mission to provide you with exceptional heart care, we have created designated Provider Care Teams.  These Care Teams include your primary Cardiologist (physician) and Advanced Practice Providers (APPs -  Physician Assistants and Nurse Practitioners) who all work together to provide you with the care you need, when you need it.  We recommend signing up for the patient portal called "MyChart".  Sign up information is provided on this After Visit Summary.  MyChart is used to connect with patients for Virtual Visits (Telemedicine).  Patients are able to view lab/test results, encounter notes, upcoming appointments, etc.  Non-urgent messages can be sent to your provider as well.   To learn more about what you can do with MyChart, go to [email or web address].    Your next appointment:   1 month(s)  The format for your next appointment:   In Person  Provider:   Gypsy Balsam, MD   Other Instructions  Losartan Tablets What is this medicine? LOSARTAN (loe SAR tan) is an angiotensin II receptor blocker, also known as an ARB. It treats high blood pressure. It can slow kidney damage in some patients. It may also be used to lower  the risk of stroke. This medicine may be used for other purposes; ask your health care provider or pharmacist if you have questions. COMMON BRAND NAME(S): Cozaar What should I tell my health care provider before I take this medicine? They need to know if you have any of these conditions:  heart failure  kidney or liver disease  an unusual or allergic reaction to losartan, other medicines, foods, dyes, or preservatives  pregnant or trying to get pregnant  breast-feeding How should I use this medicine? Take this drug by mouth. Take it as directed on the prescription label at the same time every day. You can take it with or without food. If it upsets your stomach, take it with food. Keep taking it unless your health care provider tells you to stop. Talk to your health care provider about the use of this drug in children. While it may be prescribed for children as young as 6 for selected conditions, precautions do apply. Overdosage: If you think you have taken too much of this medicine contact a poison control center or emergency room at once. NOTE: This medicine is only for you. Do not share this medicine with others. What if I miss a dose? If you miss a dose, take it as soon as you can. If it is almost time for your next dose, take only that dose. Do not take double or extra doses. What may interact with this medicine?  blood pressure medicines  diuretics, especially triamterene, spironolactone,  or amiloride  fluconazole  NSAIDs, medicines for pain and inflammation, like ibuprofen or naproxen  potassium salts or potassium supplements  rifampin This list may not describe all possible interactions. Give your health care provider a list of all the medicines, herbs, non-prescription drugs, or dietary supplements you use. Also tell them if you smoke, drink alcohol, or use illegal drugs. Some items may interact with your medicine. What should I watch for while using this medicine? Visit  your doctor or health care professional for regular checks on your progress. Check your blood pressure as directed. Ask your doctor or health care professional what your blood pressure should be and when you should contact him or her. Call your doctor or health care professional if you notice an irregular or fast heart beat. Women should inform their doctor if they wish to become pregnant or think they might be pregnant. There is a potential for serious side effects to an unborn child, particularly in the second or third trimester. Talk to your health care professional or pharmacist for more information. You may get drowsy or dizzy. Do not drive, use machinery, or do anything that needs mental alertness until you know how this drug affects you. Do not stand or sit up quickly, especially if you are an older patient. This reduces the risk of dizzy or fainting spells. Alcohol can make you more drowsy and dizzy. Avoid alcoholic drinks. Avoid salt substitutes unless you are told otherwise by your doctor or health care professional. Do not treat yourself for coughs, colds, or pain while you are taking this medicine without asking your doctor or health care professional for advice. Some ingredients may increase your blood pressure. What side effects may I notice from receiving this medicine? Side effects that you should report to your doctor or health care professional as soon as possible:  confusion, dizziness, light headedness or fainting spells  decreased amount of urine passed  difficulty breathing or swallowing, hoarseness, or tightening of the throat  fast or irregular heart beat, palpitations, or chest pain  skin rash, itching  swelling of your face, lips, tongue, hands, or feet Side effects that usually do not require medical attention (report to your doctor or health care professional if they continue or are bothersome):  cough  decreased sexual function or desire  headache  nasal  congestion or stuffiness  nausea or stomach pain  sore or cramping muscles This list may not describe all possible side effects. Call your doctor for medical advice about side effects. You may report side effects to FDA at 1-800-FDA-1088. Where should I keep my medicine? Keep out of the reach of children and pets. Store at room temperature between 15 and 30 degrees C (59 and 86 degrees F). Protect from light. Keep the container tightly closed. Throw away any unused drug after the expiration date. NOTE: This sheet is a summary. It may not cover all possible information. If you have questions about this medicine, talk to your doctor, pharmacist, or health care provider.  2020 Elsevier/Gold Standard (2018-11-03 12:12:28)

## 2020-03-09 ENCOUNTER — Telehealth: Payer: Self-pay | Admitting: Nurse Practitioner

## 2020-03-09 NOTE — Telephone Encounter (Signed)
   Pt called to report that she took a dose losartan 25mg  on the evening of 11/24 and another on the morning of 11/25.  Throughout the day yesterday, she was very fatigued and "felt high."  She now notes lapses in memory, forgetting conversations, etc.  She did not take losartan this AM and feels back to baseline.  Pressures on losartan were 140-160.  She notes a h/o hypersensitivity to medications.  I rec that she may need to try taking only 1/2 a tab to test tolerance, prior to potentially going back up to a full tab in the future.  She prefers to hold off on taking any more losartan this weekend and will call the office on Monday to arrange f/u and new Rx for alternate medication.  Sunday, NP 03/09/2020, 4:38 PM

## 2020-03-13 ENCOUNTER — Telehealth: Payer: Self-pay | Admitting: Cardiology

## 2020-03-13 NOTE — Telephone Encounter (Signed)
Pt c/o medication issue:  1. Name of Medication: losartan (COZAAR) 25 MG tablet  2. How are you currently taking this medication (dosage and times per day)? As written  3. Are you having a reaction (difficulty breathing--STAT)? No  4. What is your medication issue? Medication made patient dehydrated, loss of appetite, very fatigued, poor memory and attention span

## 2020-03-14 DIAGNOSIS — I480 Paroxysmal atrial fibrillation: Secondary | ICD-10-CM | POA: Diagnosis not present

## 2020-03-14 DIAGNOSIS — R002 Palpitations: Secondary | ICD-10-CM | POA: Diagnosis not present

## 2020-03-14 DIAGNOSIS — R0683 Snoring: Secondary | ICD-10-CM | POA: Diagnosis not present

## 2020-03-14 NOTE — Telephone Encounter (Signed)
Lets stop the medication and see if she feels any better

## 2020-03-14 NOTE — Telephone Encounter (Signed)
Patient informed she can stop and let us know if this helps or not.

## 2020-03-15 LAB — BASIC METABOLIC PANEL
BUN/Creatinine Ratio: 14 (ref 9–23)
BUN: 9 mg/dL (ref 6–20)
CO2: 22 mmol/L (ref 20–29)
Calcium: 9.9 mg/dL (ref 8.7–10.2)
Chloride: 96 mmol/L (ref 96–106)
Creatinine, Ser: 0.63 mg/dL (ref 0.57–1.00)
GFR calc Af Amer: 134 mL/min/{1.73_m2} (ref >59)
GFR calc non Af Amer: 117 mL/min/{1.73_m2} (ref >59)
Glucose: 85 mg/dL (ref 65–99)
Potassium: 3.7 mmol/L (ref 3.5–5.2)
Sodium: 134 mmol/L (ref 134–144)

## 2020-04-11 ENCOUNTER — Ambulatory Visit (INDEPENDENT_AMBULATORY_CARE_PROVIDER_SITE_OTHER): Payer: BC Managed Care – PPO | Admitting: Cardiology

## 2020-04-11 ENCOUNTER — Encounter: Payer: Self-pay | Admitting: Cardiology

## 2020-04-11 ENCOUNTER — Other Ambulatory Visit: Payer: Self-pay

## 2020-04-11 VITALS — BP 134/76 | HR 76 | Ht 64.0 in | Wt 189.4 lb

## 2020-04-11 DIAGNOSIS — I480 Paroxysmal atrial fibrillation: Secondary | ICD-10-CM

## 2020-04-11 DIAGNOSIS — R002 Palpitations: Secondary | ICD-10-CM

## 2020-04-11 DIAGNOSIS — I10 Essential (primary) hypertension: Secondary | ICD-10-CM | POA: Diagnosis not present

## 2020-04-11 NOTE — Patient Instructions (Signed)
Medication Instructions:  Your physician recommends that you continue on your current medications as directed. Please refer to the Current Medication list given to you today.  *If you need a refill on your cardiac medications before your next appointment, please call your pharmacy*   Lab Work: Your physician recommends that you return for lab work in: TODAY BMP If you have labs (blood work) drawn today and your tests are completely normal, you will receive your results only by: . MyChart Message (if you have MyChart) OR . A paper copy in the mail If you have any lab test that is abnormal or we need to change your treatment, we will call you to review the results.   Testing/Procedures: None   Follow-Up: At [facility], you and your health needs are our priority.  As part of our continuing mission to provide you with exceptional heart care, we have created designated Provider Care Teams.  These Care Teams include your primary Cardiologist (physician) and Advanced Practice Providers (APPs -  Physician Assistants and Nurse Practitioners) who all work together to provide you with the care you need, when you need it.  We recommend signing up for the patient portal called "MyChart".  Sign up information is provided on this After Visit Summary.  MyChart is used to connect with patients for Virtual Visits (Telemedicine).  Patients are able to view lab/test results, encounter notes, upcoming appointments, etc.  Non-urgent messages can be sent to your provider as well.   To learn more about what you can do with MyChart, go to [email or web address].    Your next appointment:   3 month(s)  The format for your next appointment:   In Person  Provider:   Robert Krasowski, MD   Other Instructions   

## 2020-04-11 NOTE — Progress Notes (Signed)
Cardiology Office Note:    Date:  04/11/2020   ID:  Allison Pope, DOB [birth year], MRN [id number]  PCP:  Patient, No Pcp Per  Cardiologist:  Allison Balsam, MD    Referring MD: No ref. provider found   No chief complaint on file. I had high blood pressure  History of Present Illness:    Allison Pope is a 34 y.o. female which 1 documented episode of atrial fibrillation, mitral valve prolapse, palpitations, snoring, comes today to my office for follow-up.  Recently she went to her dentist to have dental cleaning done her blood pressure was checked she was find to have blood pressure very elevated 170/100 and her dental clinic has not been done.  She was asked to follow-up with [place] to have this issue solved.  She does check her blood pressure at home sometimes and usually see elevated number, however, she does have a blood pressure monitor on the wrist today in the office blood pressure was checked repeatedly and is good 132/76 in the right arm 134/74 on the left.  Overall she is doing well denies have any chest pain tightness squeezing pressure burning chest.  Past Medical History:  Diagnosis Date  . Acute pancreatitis without infection or necrosis 08/09/2016  . AF (paroxysmal atrial fibrillation) (HCC) 08/09/2016  . Alcohol abuse 08/12/2016  . Atrial fibrillation (HCC)   . Hypokalemia 08/09/2016  . MVP (mitral valve prolapse)   . Palpitations 05/28/2017  . Routine health maintenance 07/04/2013  . Snoring 05/28/2017  . Syncope and collapse 08/11/2018    No past surgical history on file.  Current Medications: Current Meds  Medication Sig  . metoprolol succinate (TOPROL-XL) 50 MG 24 hr tablet TAKE 1 TABLET BY MOUTH DAILY. TAKE WITH OR IMMEDIATELY FOLLOWING A MEAL.     Allergies:   Patient has no known allergies.   Social History   Socioeconomic History  . Marital status: Single    Spouse name: Not on file  . Number of children: Not on file  . Years of education: Not on file  .  Highest education level: Not on file  Occupational History  . Not on file  Tobacco Use  . Smoking status: Never Smoker  . Smokeless tobacco: Never Used  Vaping Use  . Vaping Use: Never used  Substance and Sexual Activity  . Alcohol use: Yes    Comment: occ, 2-3 per day  . Drug use: No  . Sexual activity: Not on file  Other Topics Concern  . Not on file  Social History Narrative  . Not on file   Social Determinants of Health   Financial Resource Strain: Not on file  Food Insecurity: Not on file  Transportation Needs: Not on file  Physical Activity: Not on file  Stress: Not on file  Social Connections: Not on file     Family History: The patient's family history includes Atrial fibrillation in her father; Congestive Heart Failure in her paternal grandmother; Healthy in her mother. ROS:   Please see the history of present illness.    All 14 point review of systems negative except as described per history of present illness  EKGs/Labs/Other Studies Reviewed:      Recent Labs: 03/14/2020: BUN 9; Creatinine, Ser 0.63; Potassium 3.7; Sodium 134  Recent Lipid Panel    Component Value Date/Time   CHOL 203 (H) 08/31/2019 1656   TRIG 80 08/31/2019 1656   HDL 44 08/31/2019 1656   CHOLHDL 4.6 (H) 08/31/2019 1656  CHOLHDL 2.4 08/10/2016 0744   VLDL 21 08/10/2016 0744   LDLCALC 144 (H) 08/31/2019 1656    Physical Exam:    VS:  BP 134/76 (BP Location: Left Arm, Patient Position: Sitting, Cuff Size: Normal)   Pulse 76   Ht 5\' 4"  (1.626 m)   Wt 189 lb 6.4 oz (85.9 kg)   SpO2 98%   BMI 32.51 kg/m     Wt Readings from Last 3 Encounters:  04/11/20 189 lb 6.4 oz (85.9 kg)  03/06/20 191 lb (86.6 kg)  08/31/19 176 lb (79.8 kg)     GEN:  Well nourished, well developed in no acute distress HEENT: Normal NECK: No JVD; No carotid bruits LYMPHATICS: No lymphadenopathy CARDIAC: RRR, no murmurs, no rubs, no gallops RESPIRATORY:  Clear to auscultation without rales, wheezing  or rhonchi  ABDOMEN: Soft, non-tender, non-distended MUSCULOSKELETAL:  No edema; No deformity  SKIN: Warm and dry LOWER EXTREMITIES: no swelling NEUROLOGIC:  Alert and oriented x 3 PSYCHIATRIC:  Normal affect   ASSESSMENT:    1. AF (paroxysmal atrial fibrillation) (HCC)   2. Palpitations   3. Essential hypertension    PLAN:    In order of problems listed above:  1. Paroxysmal atrial fibrillation only 1 documented episode.  Chads 2 vascular equals 2 however no recent episodes.  I will continue without anticoagulation for now. 2. Essential hypertension which appears to be uncontrolled now however today in the office looks good I will ask her to get good blood pressure monitor that she can check blood pressure on her arm I asked her to check blood pressure once a day for next 2 weeks and bring results to 09/02/19.  I will check a Chem-7 with anticipation of need to increase the dose of losartan. 3. Palpitations denies having any. 4. Dyslipidemia her LDL is 144 HDL 44 this is from May 1921.  This is from K PN, I encouraged to exercise on the regular basis and stick with a good diet   Medication Adjustments/Labs and Tests Ordered: Current medicines are reviewed at length with the patient today.  Concerns regarding medicines are outlined above.  No orders of the defined types were placed in this encounter.  Medication changes: No orders of the defined types were placed in this encounter.   Signed, June 1921, MD, [facility] 04/11/2020 3:57 PM    [facility]

## 2020-04-12 LAB — BASIC METABOLIC PANEL
BUN/Creatinine Ratio: 13 (ref 9–23)
BUN: 8 mg/dL (ref 6–20)
CO2: 20 mmol/L (ref 20–29)
Calcium: 10 mg/dL (ref 8.7–10.2)
Chloride: 98 mmol/L (ref 96–106)
Creatinine, Ser: 0.62 mg/dL (ref 0.57–1.00)
GFR calc Af Amer: 135 mL/min/{1.73_m2} (ref >59)
GFR calc non Af Amer: 117 mL/min/{1.73_m2} (ref >59)
Glucose: 91 mg/dL (ref 65–99)
Potassium: 3.9 mmol/L (ref 3.5–5.2)
Sodium: 133 mmol/L — ABNORMAL LOW (ref 134–144)

## 2020-05-01 ENCOUNTER — Encounter (HOSPITAL_COMMUNITY): Payer: Self-pay | Admitting: Emergency Medicine

## 2020-05-01 ENCOUNTER — Ambulatory Visit (HOSPITAL_COMMUNITY)
Admission: EM | Admit: 2020-05-01 | Discharge: 2020-05-01 | Disposition: A | Discharge status: Home / Self Care | Payer: BC Managed Care – PPO | Attending: Student | Admitting: Student

## 2020-05-01 DIAGNOSIS — J069 Acute upper respiratory infection, unspecified: Secondary | ICD-10-CM | POA: Diagnosis not present

## 2020-05-01 DIAGNOSIS — I10 Essential (primary) hypertension: Secondary | ICD-10-CM | POA: Diagnosis not present

## 2020-05-01 DIAGNOSIS — Z8679 Personal history of other diseases of the circulatory system: Secondary | ICD-10-CM

## 2020-05-01 DIAGNOSIS — U071 COVID-19: Secondary | ICD-10-CM | POA: Diagnosis not present

## 2020-05-01 DIAGNOSIS — R197 Diarrhea, unspecified: Secondary | ICD-10-CM | POA: Diagnosis not present

## 2020-05-01 LAB — SARS CORONAVIRUS 2 (TAT 6-24 HRS): SARS Coronavirus 2: POSITIVE — AB

## 2020-05-01 MED ORDER — LOPERAMIDE HCL 2 MG PO CAPS: 2.0000 mg | ORAL_CAPSULE | Freq: Four times a day (QID) | ORAL | 0 refills | Status: DC | PRN

## 2020-05-01 MED ORDER — ONDANSETRON HCL 8 MG PO TABS: 8.0000 mg | ORAL_TABLET | Freq: Three times a day (TID) | ORAL | 0 refills | Status: DC | PRN

## 2020-05-01 NOTE — ED Provider Notes (Signed)
[facility]    CSN: [id number] Arrival date & time: 05/01/20  1233      History   Chief Complaint Chief Complaint  Patient presents with  . URI    HPI Allison Pope is a 35 y.o. female Presenting for URI symptoms for 3 days. History of hypertension, afib, pancreatitis, palpitations, syncope, MVP. Presenting today with 3 days of diarrhea, bilateral ear pressure, nausea, sore throat, headaches, fatigue, body aches. States she's had diarrhea for 12 hours, once an hour overnight. Endorses occ nausea but no vomiting or abd pain. Ear pressure but no pain, hearing changes, dizziness. States sore throat has resolved on its own. She has been taking no medications for symptoms. Denies fevers/chills, nausea, shortness of breath, chest pain, facial pain, teeth pain, loss of taste/smell, swollen lymph nodes.. Denies worst headache of life, thunderclap headache, weakness/sensation changes in arms/legs, vision changes, shortness of breath, chest pain/pressure, photophobia, phonophobia. Denies chest pain, shortness of breath, confusion, high fevers.   She also has a history of hypertension, and stopped her blood pressure medications on her own 1 month ago. Followed by cardiologist for this. Denies vision changes, shortness of breath, chest pain, etc.     HPI  Past Medical History:  Diagnosis Date  . Acute pancreatitis without infection or necrosis 08/09/2016  . AF (paroxysmal atrial fibrillation) (HCC) 08/09/2016  . Alcohol abuse 08/12/2016  . Atrial fibrillation (HCC)   . Hypokalemia 08/09/2016  . MVP (mitral valve prolapse)   . Palpitations 05/28/2017  . Routine health maintenance 07/04/2013  . Snoring 05/28/2017  . Syncope and collapse 08/11/2018    Patient Active Problem List   Diagnosis Date Noted  . Essential hypertension 04/11/2020  . MVP (mitral valve prolapse)   . Atrial fibrillation (HCC)   . Syncope and collapse 08/11/2018  . Palpitations 05/28/2017  . Snoring 05/28/2017  .  Alcohol abuse 08/12/2016  . Acute pancreatitis without infection or necrosis 08/09/2016  . Hypokalemia 08/09/2016  . AF (paroxysmal atrial fibrillation) (HCC) 08/09/2016  . Routine health maintenance 07/04/2013    History reviewed. No pertinent surgical history.  OB History   No obstetric history on file.      Home Medications    Prior to Admission medications   Medication Sig Start Date End Date Taking? Authorizing Provider  loperamide (IMODIUM) 2 MG capsule Take 1 capsule (2 mg total) by mouth 4 (four) times daily as needed for diarrhea or loose stools. 05/01/20  Yes Rhys Martini, PA-C  ondansetron (ZOFRAN) 8 MG tablet Take 1 tablet (8 mg total) by mouth every 8 (eight) hours as needed for nausea or vomiting. 05/01/20  Yes Rhys Martini, PA-C  losartan (COZAAR) 25 MG tablet Take 1 tablet (25 mg total) by mouth daily. 03/06/20 06/04/20  Georgeanna Lea, MD  metoprolol succinate (TOPROL-XL) 50 MG 24 hr tablet TAKE 1 TABLET BY MOUTH DAILY. TAKE WITH OR IMMEDIATELY FOLLOWING A MEAL. 12/15/19   Georgeanna Lea, MD    Family History Family History  Problem Relation Age of Onset  . Atrial fibrillation Father   . Congestive Heart Failure Paternal Grandmother   . Healthy Mother     Social History Social History   Tobacco Use  . Smoking status: Never Smoker  . Smokeless tobacco: Never Used  Vaping Use  . Vaping Use: Never used  Substance Use Topics  . Alcohol use: Yes    Comment: occ, 2-3 per day  . Drug use: No  Allergies   Patient has no known allergies.   Review of Systems Review of Systems  Constitutional: Negative for appetite change, chills and fever.  HENT: Positive for congestion and sore throat. Negative for ear pain, rhinorrhea, sinus pressure and sinus pain.   Eyes: Negative for redness and visual disturbance.  Respiratory: Positive for cough. Negative for chest tightness, shortness of breath and wheezing.   Cardiovascular: Negative for chest  pain and palpitations.  Gastrointestinal: Positive for diarrhea and nausea. Negative for abdominal pain, constipation and vomiting.  Genitourinary: Negative for dysuria, frequency and urgency.  Musculoskeletal: Negative for myalgias.  Neurological: Negative for dizziness, weakness and headaches.  Psychiatric/Behavioral: Negative for confusion.  All other systems reviewed and are negative.    Physical Exam Triage Vital Signs ED Triage Vitals  Enc Vitals Group     BP 05/01/20 1334 (S) (!) 159/111     Pulse Rate 05/01/20 1334 93     Resp 05/01/20 1334 18     Temp 05/01/20 1334 99.8 F (37.7 C)     Temp Source 05/01/20 1334 Oral     SpO2 05/01/20 1334 98 %     Weight --      Height --      Head Circumference --      Peak Flow --      Pain Score 05/01/20 1337 6     Pain Loc --      Pain Edu? --      Excl. in GC? --    No data found.  Updated Vital Signs BP (S) (!) 159/111 (BP Location: Right Arm)   Pulse 93   Temp 99.8 F (37.7 C) (Oral)   Resp 18   LMP 04/10/2020   SpO2 98%   Visual Acuity Right Eye Distance:   Left Eye Distance:   Bilateral Distance:    Right Eye Near:   Left Eye Near:    Bilateral Near:     Physical Exam Vitals reviewed.  Constitutional:      General: She is not in acute distress.    Appearance: Normal appearance. She is ill-appearing.  HENT:     Head: Normocephalic and atraumatic.     Right Ear: Hearing, tympanic membrane, ear canal and external ear normal. No swelling or tenderness. There is no impacted cerumen. No mastoid tenderness. Tympanic membrane is not perforated, erythematous, retracted or bulging.     Left Ear: Hearing, tympanic membrane, ear canal and external ear normal. No swelling or tenderness. There is no impacted cerumen. No mastoid tenderness. Tympanic membrane is not perforated, erythematous, retracted or bulging.     Nose:     Right Sinus: No maxillary sinus tenderness or frontal sinus tenderness.     Left Sinus: No  maxillary sinus tenderness or frontal sinus tenderness.     Mouth/Throat:     Mouth: Mucous membranes are moist.     Pharynx: Uvula midline. No oropharyngeal exudate or posterior oropharyngeal erythema.     Tonsils: No tonsillar exudate.  Eyes:     Extraocular Movements: Extraocular movements intact.     Pupils: Pupils are equal, round, and reactive to light.  Cardiovascular:     Rate and Rhythm: Normal rate and regular rhythm.     Heart sounds: Normal heart sounds.  Pulmonary:     Breath sounds: Normal breath sounds and air entry. No wheezing, rhonchi or rales.  Chest:     Chest wall: No tenderness.  Abdominal:     General: Abdomen is  flat. Bowel sounds are normal.     Tenderness: There is no abdominal tenderness. There is no guarding or rebound.  Lymphadenopathy:     Cervical: No cervical adenopathy.  Neurological:     General: No focal deficit present.     Mental Status: She is alert and oriented to person, place, and time.  Psychiatric:        Attention and Perception: Attention and perception normal.        Mood and Affect: Mood and affect normal.        Behavior: Behavior normal. Behavior is cooperative.        Thought Content: Thought content normal.        Judgment: Judgment normal.      UC Treatments / Results  Labs (all labs ordered are listed, but only abnormal results are displayed) Labs Reviewed  SARS CORONAVIRUS 2 (TAT 6-24 HRS)    EKG   Radiology No results found.  Procedures Procedures (including critical care time)  Medications Ordered in UC Medications - No data to display  Initial Impression / Assessment and Plan / UC Course  I have reviewed the triage vital signs and the nursing notes.  Pertinent labs & imaging results that were available during my care of the patient were reviewed by me and considered in my medical decision making (see chart for details).     Covid test sent today. Isolation precautions per CDC guidelines until negative  result. Symptomatic relief with OTC Mucinex, Nyquil, etc. Return precautions- new/worsening fevers/chills, shortness of breath, chest pain, abd pain, etc.   Immodium and zofran as below. rec good hydration, BRAT diet.   Follow-up with your cardiologist to discuss restarting blood pressure medications. Head straight to ED if chest pain, shortness of breath, vision change, worse headache of life, etc.   Final Clinical Impressions(s) / UC Diagnoses   Final diagnoses:  Viral upper respiratory tract infection  Diarrhea of presumed infectious origin  Essential hypertension     Discharge Instructions     -Zofran up to 3x daily for nausea and vomiting -Immodium up to 4x daily for diarrhea -Drink plenty of water and eat a bland diet as tolerated -For fevers/chills, body aches, headaches- use Tylenol and Ibuprofen. You can alternate these for maximum effect. Use up to 3000mg  Tylenol daily and 3200mg  Ibuprofen daily. Make sure to take ibuprofen with food. Check the bottle of ibuprofen/tylenol for specific dosage instructions. You can also try Excedrin migraine for headache.  -Restart your blood pressure medications. Message your cardiologist if you have any questions or concerns.   We are currently awaiting result of your PCR covid-19 test. This typically comes back in 1-2 days. We'll call you if the result is positive. Otherwise, the result will be sent electronically to your MyChart. You can also call this clinic and ask for your result via telephone.   Please isolate at home while awaiting these results. If your test is positive for Covid-19, continue to isolate at home for 5 days if you have mild symptoms, or a total of 10 days from symptom onset if you have more severe symptoms. If you quarantine for a shorter period of time (i.e. 5 days), make sure to wear a mask until day 10 of symptoms. Treat your symptoms at home with OTC remedies like tylenol/ibuprofen, mucinex, nyquil, etc. Seek medical  attention if you develop high fevers, chest pain, shortness of breath, ear pain, facial pain, etc. Make sure to get up and move around every  2-3 hours while convalescing to help prevent blood clots. Drink plenty of fluids, and rest as much as possible.    ED Prescriptions    Medication Sig Dispense Auth. Provider   loperamide (IMODIUM) 2 MG capsule Take 1 capsule (2 mg total) by mouth 4 (four) times daily as needed for diarrhea or loose stools. 12 capsule Ignacia Bayley E, PA-C   ondansetron (ZOFRAN) 8 MG tablet Take 1 tablet (8 mg total) by mouth every 8 (eight) hours as needed for nausea or vomiting. 20 tablet Rhys Martini, PA-C     PDMP not reviewed this encounter.   Rhys Martini, PA-C 05/01/20 1456

## 2020-05-01 NOTE — ED Triage Notes (Signed)
C/o cold sx onset 3 days associated w/diarrhea, bilateral ear pain, nausea, sore throat, headache, fatigue, body aches  BP today is 159/111 but has not been taking her meds x1 month.  A&O x4... NAD.Marland Kitchen. ambulatory

## 2020-05-01 NOTE — Discharge Instructions (Addendum)
-  Zofran up to 3x daily for nausea and vomiting -Immodium up to 4x daily for diarrhea -Drink plenty of water and eat a bland diet as tolerated -For fevers/chills, body aches, headaches- use Tylenol and Ibuprofen. You can alternate these for maximum effect. Use up to 3000mg  Tylenol daily and 3200mg  Ibuprofen daily. Make sure to take ibuprofen with food. Check the bottle of ibuprofen/tylenol for specific dosage instructions. You can also try Excedrin migraine for headache.  -Restart your blood pressure medications. Message your cardiologist if you have any questions or concerns.   We are currently awaiting result of your PCR covid-19 test. This typically comes back in 1-2 days. We'll call you if the result is positive. Otherwise, the result will be sent electronically to your MyChart. You can also call this clinic and ask for your result via telephone.   Please isolate at home while awaiting these results. If your test is positive for Covid-19, continue to isolate at home for 5 days if you have mild symptoms, or a total of 10 days from symptom onset if you have more severe symptoms. If you quarantine for a shorter period of time (i.e. 5 days), make sure to wear a mask until day 10 of symptoms. Treat your symptoms at home with OTC remedies like tylenol/ibuprofen, mucinex, nyquil, etc. Seek medical attention if you develop high fevers, chest pain, shortness of breath, ear pain, facial pain, etc. Make sure to get up and move around every 2-3 hours while convalescing to help prevent blood clots. Drink plenty of fluids, and rest as much as possible.

## 2020-05-02 ENCOUNTER — Telehealth: Payer: Self-pay | Admitting: *Deleted

## 2020-05-02 ENCOUNTER — Telehealth: Payer: Self-pay | Admitting: Unknown Physician Specialty

## 2020-05-02 NOTE — Telephone Encounter (Signed)
Called to discuss with patient about COVID-19 symptoms and the use of one of the available treatments for those with mild to moderate Covid symptoms and at a high risk of hospitalization.  Pt appears to qualify for outpatient treatment due to co-morbid conditions and/or a member of an at-risk group in accordance with the FDA Emergency Use Authorization.   Patient has current symptoms including ST, cough, diarrhea. Explained the reason I was calling-She stated she felt terrible, answered symptoms/when they began. Then hung up.   Symptom onset:  04/29/20 Today is day 4 of symptoms Vaccinated: Unsure- Booster?  Immunocompromised?  Qualifiers: Afib per chart history    Elgie Maziarz, Lenon Ahmadi

## 2020-05-02 NOTE — Telephone Encounter (Signed)
Called to discuss with patient about COVID-19 symptoms and the use of one of the available treatments for those with mild to moderate Covid symptoms and at a high risk of hospitalization.  Pt appears to qualify for outpatient treatment due to co-morbid conditions and/or a member of an at-risk group in accordance with the FDA Emergency Use Authorization.    She hung up when asked about her vaccination status  Gabriel Cirri

## 2020-06-02 ENCOUNTER — Other Ambulatory Visit: Payer: Self-pay | Admitting: Cardiology

## 2020-06-04 NOTE — Telephone Encounter (Signed)
Refill sent to pharmacy.   

## 2020-06-21 ENCOUNTER — Ambulatory Visit: Payer: BC Managed Care – PPO | Admitting: Cardiology

## 2020-07-10 ENCOUNTER — Ambulatory Visit (INDEPENDENT_AMBULATORY_CARE_PROVIDER_SITE_OTHER): Payer: BC Managed Care – PPO | Admitting: Cardiology

## 2020-07-10 ENCOUNTER — Other Ambulatory Visit: Payer: Self-pay

## 2020-07-10 ENCOUNTER — Encounter: Payer: Self-pay | Admitting: Cardiology

## 2020-07-10 VITALS — BP 138/96 | HR 72 | Ht 64.0 in | Wt 189.0 lb

## 2020-07-10 DIAGNOSIS — I480 Paroxysmal atrial fibrillation: Secondary | ICD-10-CM

## 2020-07-10 DIAGNOSIS — I341 Nonrheumatic mitral (valve) prolapse: Secondary | ICD-10-CM

## 2020-07-10 DIAGNOSIS — I10 Essential (primary) hypertension: Secondary | ICD-10-CM | POA: Diagnosis not present

## 2020-07-10 DIAGNOSIS — R55 Syncope and collapse: Secondary | ICD-10-CM | POA: Diagnosis not present

## 2020-07-10 MED ORDER — LOSARTAN POTASSIUM 50 MG PO TABS: 50.0000 mg | ORAL_TABLET | Freq: Every day | ORAL | 3 refills | Status: DC

## 2020-07-10 NOTE — Progress Notes (Signed)
Cardiology Office Note:    Date:  07/10/2020   ID:  Allison Pope, DOB 1986/04/01, MRN [id number]  PCP:  Patient, No Pcp Per (Inactive)  Cardiologist:  Gypsy Balsam, MD    Referring MD: No ref. provider found   No chief complaint on file. Am doing fine  History of Present Illness:    Allison Pope is a 35 y.o. female with past medical history significant for 1 documented episode of atrial fibrillation, her CHA2DS2-VASc score is 2 however since she had only one episode she is not anticoagulated, essential hypertension, palpitations.  She comes today 2 months of follow-up overall doing well denies have any chest pain tightness squeezing pressure burning chest no palpitations.  Still work a lot and busy working however she only works 1 job not like before to she does not remember when she got vacation last time.  Past Medical History:  Diagnosis Date  . Acute pancreatitis without infection or necrosis 08/09/2016  . AF (paroxysmal atrial fibrillation) (HCC) 08/09/2016  . Alcohol abuse 08/12/2016  . Atrial fibrillation (HCC)   . Hypokalemia 08/09/2016  . MVP (mitral valve prolapse)   . Palpitations 05/28/2017  . Routine health maintenance 07/04/2013  . Snoring 05/28/2017  . Syncope and collapse 08/11/2018    Past Surgical History:  Procedure Laterality Date  . No past surgery      Current Medications: Current Meds  Medication Sig  . losartan (COZAAR) 25 MG tablet Take 1 tablet (25 mg total) by mouth daily.  . metoprolol succinate (TOPROL-XL) 50 MG 24 hr tablet TAKE 1 TABLET BY MOUTH DAILY WITH OR IMMEDIATELY FOLLOWING A MEAL (Patient taking differently: Take 50 mg by mouth daily. TAKE 1 TABLET BY MOUTH DAILY WITH OR IMMEDIATELY FOLLOWING A MEAL)     Allergies:   Patient has no known allergies.   Social History   Socioeconomic History  . Marital status: Single    Spouse name: Not on file  . Number of children: Not on file  . Years of education: Not on file  . Highest education  level: Not on file  Occupational History  . Not on file  Tobacco Use  . Smoking status: Never Smoker  . Smokeless tobacco: Never Used  Vaping Use  . Vaping Use: Never used  Substance and Sexual Activity  . Alcohol use: Yes    Comment: occ, 2-3 per day  . Drug use: No  . Sexual activity: Not on file  Other Topics Concern  . Not on file  Social History Narrative  . Not on file   Social Determinants of Health   Financial Resource Strain: Not on file  Food Insecurity: Not on file  Transportation Needs: Not on file  Physical Activity: Not on file  Stress: Not on file  Social Connections: Not on file     Family History: The patient's family history includes Atrial fibrillation in her father; Congestive Heart Failure in her paternal grandmother; Healthy in her mother. ROS:   Please see the history of present illness.    All 14 point review of systems negative except as described per history of present illness  EKGs/Labs/Other Studies Reviewed:      Recent Labs: 04/11/2020: BUN 8; Creatinine, Ser 0.62; Potassium 3.9; Sodium 133  Recent Lipid Panel    Component Value Date/Time   CHOL 203 (H) 08/31/2019 1656   TRIG 80 08/31/2019 1656   HDL 44 08/31/2019 1656   CHOLHDL 4.6 (H) 08/31/2019 1656   CHOLHDL 2.4  08/10/2016 0744   VLDL 21 08/10/2016 0744   LDLCALC 144 (H) 08/31/2019 1656    Physical Exam:    VS:  BP (!) 138/96 (BP Location: Right Arm, Patient Position: Sitting)   Pulse 72   Ht 5\' 4"  (1.626 m)   Wt 189 lb (85.7 kg)   SpO2 94%   BMI 32.44 kg/m     Wt Readings from Last 3 Encounters:  07/10/20 189 lb (85.7 kg)  04/11/20 189 lb 6.4 oz (85.9 kg)  03/06/20 191 lb (86.6 kg)     GEN:  Well nourished, well developed in no acute distress HEENT: Normal NECK: No JVD; No carotid bruits LYMPHATICS: No lymphadenopathy CARDIAC: RRR, no murmurs, no rubs, no gallops RESPIRATORY:  Clear to auscultation without rales, wheezing or rhonchi  ABDOMEN: Soft,  non-tender, non-distended MUSCULOSKELETAL:  No edema; No deformity  SKIN: Warm and dry LOWER EXTREMITIES: no swelling NEUROLOGIC:  Alert and oriented x 3 PSYCHIATRIC:  Normal affect   ASSESSMENT:    1. AF (paroxysmal atrial fibrillation) (HCC)   2. MVP (mitral valve prolapse)   3. Essential hypertension   4. Syncope and collapse    PLAN:    In order of problems listed above:  1. Paroxysmal atrial fibrillation only 1 documented episode since that time none.  She had elected not to pursue anticoagulation.  Her CHA2DS2-VASc score is only 2.  Denies have any recent palpitations. 2. Mitral valve prolapse noncritical continue monitoring. 3. Essential hypertension, elevated.  I will increase dose of her losartan from 25 to 50 mg, will check her Chem-7 next week. 4. Dyslipidemia I did review her K PN which show me her LDL of 144 HDL 44.  We will schedule her to have fasting lipid profile done when she comes here for her Chem-7.   Medication Adjustments/Labs and Tests Ordered: Current medicines are reviewed at length with the patient today.  Concerns regarding medicines are outlined above.  No orders of the defined types were placed in this encounter.  Medication changes: No orders of the defined types were placed in this encounter.   Signed, 03/08/20, MD, [facility] 07/10/2020 3:34 PM    [facility]

## 2020-07-10 NOTE — Patient Instructions (Signed)
Medication Instructions:  Your physician has recommended you make the following change in your medication: INCREASE: Losartan (Cozaar) 50 mg once daily *If you need a refill on your cardiac medications before your next appointment, please call your pharmacy*   Lab Work: Your physician recommends that you return for lab work: NEXT WEEK: BMET, Lipids (come fasting)  If you have labs (blood work) drawn today and your tests are completely normal, you will receive your results only by: Marland Kitchen MyChart Message (if you have MyChart) OR . A paper copy in the mail If you have any lab test that is abnormal or we need to change your treatment, we will call you to review the results.   Testing/Procedures: None   Follow-Up: At [facility], you and your health needs are our priority.  As part of our continuing mission to provide you with exceptional heart care, we have created designated Provider Care Teams.  These Care Teams include your primary Cardiologist (physician) and Advanced Practice Providers (APPs -  Physician Assistants and Nurse Practitioners) who all work together to provide you with the care you need, when you need it.  We recommend signing up for the patient portal called "MyChart".  Sign up information is provided on this After Visit Summary.  MyChart is used to connect with patients for Virtual Visits (Telemedicine).  Patients are able to view lab/test results, encounter notes, upcoming appointments, etc.  Non-urgent messages can be sent to your provider as well.   To learn more about what you can do with MyChart, go to [email or web address].    Your next appointment:   6 month(s)  The format for your next appointment:   In Person  Provider:   Gypsy Balsam, MD   Other Instructions

## 2020-12-25 ENCOUNTER — Ambulatory Visit: Payer: BC Managed Care – PPO | Admitting: Cardiology

## 2020-12-27 ENCOUNTER — Other Ambulatory Visit: Payer: Self-pay | Admitting: Cardiology

## 2021-02-18 ENCOUNTER — Ambulatory Visit (INDEPENDENT_AMBULATORY_CARE_PROVIDER_SITE_OTHER): Payer: BC Managed Care – PPO | Admitting: Cardiology

## 2021-02-18 ENCOUNTER — Encounter: Payer: Self-pay | Admitting: Cardiology

## 2021-02-18 ENCOUNTER — Other Ambulatory Visit: Payer: Self-pay

## 2021-02-18 VITALS — BP 152/102 | HR 78 | Ht 65.0 in | Wt 189.0 lb

## 2021-02-18 DIAGNOSIS — I341 Nonrheumatic mitral (valve) prolapse: Secondary | ICD-10-CM | POA: Diagnosis not present

## 2021-02-18 DIAGNOSIS — I480 Paroxysmal atrial fibrillation: Secondary | ICD-10-CM | POA: Diagnosis not present

## 2021-02-18 DIAGNOSIS — R0683 Snoring: Secondary | ICD-10-CM | POA: Diagnosis not present

## 2021-02-18 DIAGNOSIS — R002 Palpitations: Secondary | ICD-10-CM

## 2021-02-18 NOTE — Patient Instructions (Signed)

## 2021-02-18 NOTE — Progress Notes (Signed)
Cardiology Office Note:    Date:  02/18/2021   ID:  Allison Pope, DOB 1986-03-09, MRN [id number]  PCP:  Patient, No Pcp Per (Inactive)  Cardiologist:  Gypsy Balsam, MD    Referring MD: No ref. provider found   Chief Complaint  Patient presents with   Follow-up    History of Present Illness:    Allison Pope is a 34 y.o. female with past medical history significant for 1 documented episode of atrial fibrillation, also history of mitral valve prolapse however echocardiogram done in 2019 did not show evidence of mitral valve prolapse.  There is no stenosis no regurgitation.  There is also some EtOH abuse issue.  She snores at night.  Also essential hypertension. She comes to my office for follow-up.  She is doing very well.  Denies any chest pain tightness squeezing pressure burning chest.  She is very busy because she is in process of moving to a different location.  She is moving to [place].  Her blood pressure is elevated today but she said that she forgot her blood pressure medications today she also tells me that is a very rare occasion that she forgets to take his medications.  She does have dyslipidemi, I will have her check her cholesterol today again.  She does not smoke.  Past Medical History:  Diagnosis Date   Acute pancreatitis without infection or necrosis 08/09/2016   AF (paroxysmal atrial fibrillation) (HCC) 08/09/2016   Alcohol abuse 08/12/2016   Atrial fibrillation (HCC)    Hypokalemia 08/09/2016   MVP (mitral valve prolapse)    Palpitations 05/28/2017   Routine health maintenance 07/04/2013   Snoring 05/28/2017   Syncope and collapse 08/11/2018    Past Surgical History:  Procedure Laterality Date   No past surgery      Current Medications: Current Meds  Medication Sig   losartan (COZAAR) 50 MG tablet Take 1 tablet (50 mg total) by mouth daily.   metoprolol succinate (TOPROL-XL) 50 MG 24 hr tablet TAKE 1 TABLET BY MOUTH DAILY WITH OR IMMEDIATELY FOLLOWING A MEAL  (Patient taking differently: Take 50 mg by mouth daily. TAKE 1 TABLET BY MOUTH DAILY WITH OR IMMEDIATELY FOLLOWING A MEAL)     Allergies:   Patient has no known allergies.   Social History   Socioeconomic History   Marital status: Single    Spouse name: Not on file   Number of children: Not on file   Years of education: Not on file   Highest education level: Not on file  Occupational History   Not on file  Tobacco Use   Smoking status: Never   Smokeless tobacco: Never  Vaping Use   Vaping Use: Never used  Substance and Sexual Activity   Alcohol use: Yes    Comment: occ, 2-3 per day   Drug use: No   Sexual activity: Not on file  Other Topics Concern   Not on file  Social History Narrative   Not on file   Social Determinants of Health   Financial Resource Strain: Not on file  Food Insecurity: Not on file  Transportation Needs: Not on file  Physical Activity: Not on file  Stress: Not on file  Social Connections: Not on file     Family History: The patient's family history includes Atrial fibrillation in her father; Congestive Heart Failure in her paternal grandmother; Healthy in her mother. ROS:   Please see the history of present illness.    All 14 point  review of systems negative except as described per history of present illness  EKGs/Labs/Other Studies Reviewed:      Recent Labs: 04/11/2020: BUN 8; Creatinine, Ser 0.62; Potassium 3.9; Sodium 133  Recent Lipid Panel    Component Value Date/Time   CHOL 203 (H) 08/31/2019 1656   TRIG 80 08/31/2019 1656   HDL 44 08/31/2019 1656   CHOLHDL 4.6 (H) 08/31/2019 1656   CHOLHDL 2.4 08/10/2016 0744   VLDL 21 08/10/2016 0744   LDLCALC 144 (H) 08/31/2019 1656    Physical Exam:    VS:  BP (!) 152/102 (BP Location: Right Arm, Patient Position: Sitting)   Pulse 78   Ht 5\' 5"  (1.651 m)   Wt 189 lb (85.7 kg)   SpO2 97%   BMI 31.45 kg/m     Wt Readings from Last 3 Encounters:  02/18/21 189 lb (85.7 kg)   07/10/20 189 lb (85.7 kg)  04/11/20 189 lb 6.4 oz (85.9 kg)     GEN:  Well nourished, well developed in no acute distress HEENT: Normal NECK: No JVD; No carotid bruits LYMPHATICS: No lymphadenopathy CARDIAC: RRR, no murmurs, no rubs, no gallops RESPIRATORY:  Clear to auscultation without rales, wheezing or rhonchi  ABDOMEN: Soft, non-tender, non-distended MUSCULOSKELETAL:  No edema; No deformity  SKIN: Warm and dry LOWER EXTREMITIES: no swelling NEUROLOGIC:  Alert and oriented x 3 PSYCHIATRIC:  Normal affect   ASSESSMENT:    1. AF (paroxysmal atrial fibrillation) (HCC)   2. MVP (mitral valve prolapse)   3. Snoring   4. Palpitations    PLAN:    In order of problems listed above:  Atrial fibrillation 1 documented episode, her CHADS2's Vascor equals 2.  But she does not want to be anticoagulated.  We will continue with beta-blocker.  She did wear a monitor which did not show any evidence of atrial fibrillation. Mitral valve prolapse I think we will distracted diagnosis of her last echocardiogram did not show significant mitral valve prolapse. Palpitations described to have rare short episode.  We will continue present management. Snoring: I suspect she got obstructive sleep apnea I wanted her to have sleep study done however she declined now.  She said she will reconsider this after she reestablish herself at new location.   Medication Adjustments/Labs and Tests Ordered: Current medicines are reviewed at length with the patient today.  Concerns regarding medicines are outlined above.  No orders of the defined types were placed in this encounter.  Medication changes: No orders of the defined types were placed in this encounter.   Signed, Park Liter, MD, [facility] 02/18/2021 3:43 PM    [place]

## 2021-02-19 LAB — LIPID PANEL
Chol/HDL Ratio: 3.1 ratio (ref 0.0–4.4)
Cholesterol, Total: 197 mg/dL (ref 100–199)
HDL: 63 mg/dL (ref >39)
LDL Chol Calc (NIH): 107 mg/dL — ABNORMAL HIGH (ref 0–99)
Triglycerides: 158 mg/dL — ABNORMAL HIGH (ref 0–149)
VLDL Cholesterol Cal: 27 mg/dL (ref 5–40)

## 2021-03-31 DIAGNOSIS — R051 Acute cough: Secondary | ICD-10-CM | POA: Diagnosis not present

## 2021-03-31 DIAGNOSIS — J101 Influenza due to other identified influenza virus with other respiratory manifestations: Secondary | ICD-10-CM | POA: Diagnosis not present

## 2021-03-31 DIAGNOSIS — Z20822 Contact with and (suspected) exposure to covid-19: Secondary | ICD-10-CM | POA: Diagnosis not present

## 2021-04-20 ENCOUNTER — Other Ambulatory Visit: Payer: Self-pay | Admitting: Cardiology

## 2021-07-23 ENCOUNTER — Emergency Department (HOSPITAL_BASED_OUTPATIENT_CLINIC_OR_DEPARTMENT_OTHER): Payer: BC Managed Care – PPO

## 2021-07-23 ENCOUNTER — Other Ambulatory Visit: Payer: Self-pay

## 2021-07-23 ENCOUNTER — Encounter (HOSPITAL_BASED_OUTPATIENT_CLINIC_OR_DEPARTMENT_OTHER): Payer: Self-pay

## 2021-07-23 ENCOUNTER — Emergency Department (HOSPITAL_BASED_OUTPATIENT_CLINIC_OR_DEPARTMENT_OTHER)
Admission: EM | Admit: 2021-07-23 | Discharge: 2021-07-23 | Disposition: A | Discharge status: Home / Self Care | Payer: BC Managed Care – PPO | Attending: Emergency Medicine | Admitting: Emergency Medicine

## 2021-07-23 DIAGNOSIS — R42 Dizziness and giddiness: Secondary | ICD-10-CM | POA: Insufficient documentation

## 2021-07-23 DIAGNOSIS — Z79899 Other long term (current) drug therapy: Secondary | ICD-10-CM | POA: Insufficient documentation

## 2021-07-23 DIAGNOSIS — R079 Chest pain, unspecified: Secondary | ICD-10-CM | POA: Diagnosis not present

## 2021-07-23 DIAGNOSIS — R0789 Other chest pain: Secondary | ICD-10-CM | POA: Diagnosis not present

## 2021-07-23 DIAGNOSIS — R1084 Generalized abdominal pain: Secondary | ICD-10-CM

## 2021-07-23 DIAGNOSIS — E876 Hypokalemia: Secondary | ICD-10-CM | POA: Insufficient documentation

## 2021-07-23 DIAGNOSIS — I700 Atherosclerosis of aorta: Secondary | ICD-10-CM | POA: Diagnosis not present

## 2021-07-23 DIAGNOSIS — Z7901 Long term (current) use of anticoagulants: Secondary | ICD-10-CM | POA: Insufficient documentation

## 2021-07-23 DIAGNOSIS — D649 Anemia, unspecified: Secondary | ICD-10-CM | POA: Insufficient documentation

## 2021-07-23 DIAGNOSIS — K859 Acute pancreatitis without necrosis or infection, unspecified: Secondary | ICD-10-CM | POA: Insufficient documentation

## 2021-07-23 DIAGNOSIS — R197 Diarrhea, unspecified: Secondary | ICD-10-CM | POA: Diagnosis not present

## 2021-07-23 DIAGNOSIS — N3000 Acute cystitis without hematuria: Secondary | ICD-10-CM

## 2021-07-23 DIAGNOSIS — R100 Acute abdomen: Secondary | ICD-10-CM | POA: Diagnosis not present

## 2021-07-23 DIAGNOSIS — I10 Essential (primary) hypertension: Secondary | ICD-10-CM | POA: Insufficient documentation

## 2021-07-23 DIAGNOSIS — K5730 Diverticulosis of large intestine without perforation or abscess without bleeding: Secondary | ICD-10-CM | POA: Diagnosis not present

## 2021-07-23 DIAGNOSIS — K760 Fatty (change of) liver, not elsewhere classified: Secondary | ICD-10-CM | POA: Diagnosis not present

## 2021-07-23 LAB — CBC
HCT: 39 % (ref 36.0–46.0)
Hemoglobin: 14 g/dL (ref 12.0–15.0)
MCH: 38.8 pg — ABNORMAL HIGH (ref 26.0–34.0)
MCHC: 35.9 g/dL (ref 30.0–36.0)
MCV: 108 fL — ABNORMAL HIGH (ref 80.0–100.0)
Platelets: 172 10*3/uL (ref 150–400)
RBC: 3.61 MIL/uL — ABNORMAL LOW (ref 3.87–5.11)
RDW: 13.6 % (ref 11.5–15.5)
WBC: 8.9 10*3/uL (ref 4.0–10.5)
nRBC: 0 % (ref 0.0–0.2)

## 2021-07-23 LAB — COMPREHENSIVE METABOLIC PANEL
ALT: 31 U/L (ref 0–44)
AST: 75 U/L — ABNORMAL HIGH (ref 15–41)
Albumin: 4.4 g/dL (ref 3.5–5.0)
Alkaline Phosphatase: 71 U/L (ref 38–126)
Anion gap: 13 (ref 5–15)
BUN: 15 mg/dL (ref 6–20)
CO2: 28 mmol/L (ref 22–32)
Calcium: 9.1 mg/dL (ref 8.9–10.3)
Chloride: 92 mmol/L — ABNORMAL LOW (ref 98–111)
Creatinine, Ser: 0.84 mg/dL (ref 0.44–1.00)
GFR, Estimated: >60 mL/min (ref >60)
Glucose, Bld: 131 mg/dL — ABNORMAL HIGH (ref 70–99)
Potassium: 2.8 mmol/L — ABNORMAL LOW (ref 3.5–5.1)
Sodium: 133 mmol/L — ABNORMAL LOW (ref 135–145)
Total Bilirubin: 3 mg/dL — ABNORMAL HIGH (ref 0.3–1.2)
Total Protein: 9.2 g/dL — ABNORMAL HIGH (ref 6.5–8.1)

## 2021-07-23 LAB — TROPONIN I (HIGH SENSITIVITY): Troponin I (High Sensitivity): 4 ng/L (ref <18)

## 2021-07-23 LAB — URINALYSIS, ROUTINE W REFLEX MICROSCOPIC
Glucose, UA: NEGATIVE mg/dL
Ketones, ur: 15 mg/dL — AB
Leukocytes,Ua: NEGATIVE
Nitrite: POSITIVE — AB
Protein, ur: >=300 mg/dL — AB
Specific Gravity, Urine: 1.02 (ref 1.005–1.030)
pH: 6.5 (ref 5.0–8.0)

## 2021-07-23 LAB — URINALYSIS, MICROSCOPIC (REFLEX)

## 2021-07-23 LAB — PREGNANCY, URINE: Preg Test, Ur: NEGATIVE

## 2021-07-23 LAB — LIPASE, BLOOD: Lipase: 486 U/L — ABNORMAL HIGH (ref 11–51)

## 2021-07-23 MED ORDER — OXYCODONE-ACETAMINOPHEN 5-325 MG PO TABS: 1.0000 | ORAL_TABLET | Freq: Four times a day (QID) | ORAL | 0 refills | Status: DC | PRN

## 2021-07-23 MED ORDER — ONDANSETRON HCL 4 MG/2ML IJ SOLN
4.0000 mg | Freq: Once | INTRAMUSCULAR | Status: AC
  Administered 2021-07-23: 4 mg via INTRAVENOUS
  Filled 2021-07-23: qty 2

## 2021-07-23 MED ORDER — ONDANSETRON 8 MG PO TBDP: 8.0000 mg | ORAL_TABLET | Freq: Three times a day (TID) | ORAL | 0 refills | Status: DC | PRN

## 2021-07-23 MED ORDER — CEPHALEXIN 500 MG PO CAPS: 500.0000 mg | ORAL_CAPSULE | Freq: Four times a day (QID) | ORAL | 0 refills | Status: DC

## 2021-07-23 MED ORDER — LACTATED RINGERS IV BOLUS
1000.0000 mL | Freq: Once | INTRAVENOUS | Status: AC
Start: 2021-07-23 — End: 2021-07-23
  Administered 2021-07-23: 1000 mL via INTRAVENOUS

## 2021-07-23 MED ORDER — FENTANYL CITRATE PF 50 MCG/ML IJ SOSY
50.0000 ug | PREFILLED_SYRINGE | Freq: Once | INTRAMUSCULAR | Status: AC
  Administered 2021-07-23: 50 ug via INTRAVENOUS
  Filled 2021-07-23: qty 1

## 2021-07-23 MED ORDER — MAGNESIUM SULFATE 2 GM/50ML IV SOLN
2.0000 g | Freq: Once | INTRAVENOUS | Status: AC
  Administered 2021-07-23: 2 g via INTRAVENOUS
  Filled 2021-07-23: qty 50

## 2021-07-23 MED ORDER — HYDROMORPHONE HCL 1 MG/ML IJ SOLN
1.0000 mg | Freq: Once | INTRAMUSCULAR | Status: AC
  Administered 2021-07-23: 1 mg via INTRAVENOUS
  Filled 2021-07-23: qty 1

## 2021-07-23 MED ORDER — POTASSIUM CHLORIDE 10 MEQ/100ML IV SOLN
10.0000 meq | INTRAVENOUS | Status: AC
  Administered 2021-07-23 (×2): 10 meq via INTRAVENOUS
  Filled 2021-07-23 (×2): qty 100

## 2021-07-23 MED ORDER — POTASSIUM CHLORIDE CRYS ER 20 MEQ PO TBCR: 20.0000 meq | EXTENDED_RELEASE_TABLET | Freq: Two times a day (BID) | ORAL | 0 refills | Status: DC

## 2021-07-23 MED ORDER — IOHEXOL 300 MG/ML  SOLN
100.0000 mL | Freq: Once | INTRAMUSCULAR | Status: AC | PRN
  Administered 2021-07-23: 100 mL via INTRAVENOUS

## 2021-07-23 MED ORDER — IBUPROFEN 800 MG PO TABS
800.0000 mg | ORAL_TABLET | Freq: Once | ORAL | Status: AC
Start: 2021-07-23 — End: 2021-07-23
  Administered 2021-07-23: 800 mg via ORAL
  Filled 2021-07-23: qty 1

## 2021-07-23 NOTE — ED Notes (Signed)
Does have a hx of HTN, unable to keep antihypertensive meds down ?

## 2021-07-23 NOTE — ED Provider Notes (Signed)
?[facility] EMERGENCY DEPARTMENT ?Provider Note ? ? ?CSN: [id number] ?Arrival date & time: 07/23/21  1233 ? ?  ? ?History ? ?Chief Complaint  ?Patient presents with  ? Abdominal Pain  ? ? ?Allison Pope is a 36 y.o. female. ? ? ?Abdominal Pain ? ?Patient with medical history notable for history of paroxysmal atrial fibrillation on metoprolol and on anticoagulation, hypertension, alcohol use disorder presents today due to multiple complaints.  She states 3 days ago she ate meatloaf from a coworker, she started having abdominal pain, vomiting and diarrhea.  She has been having continued abdominal pain and back vomiting for the last 3 days, states she feels like she is having left-sided chest pain which is sharp and radiates down her arm into her back.  It is intermittent, no obvious provoking features.  She states yesterday she was feeling lightheaded on the way to the bathroom and she thinks she "may have had a seizure I am not sure" but she did fall backwards and lose consciousness.  She is not taken anything for the abdominal pain or for the vomiting. Denies any previous abdominal surgeries. ? ?Home Medications ?Prior to Admission medications   ?Medication Sig Start Date End Date Taking? Authorizing Provider  ?losartan (COZAAR) 50 MG tablet TAKE 1 TABLET(50 MG) BY MOUTH DAILY 04/22/21   Park Liter, MD  ?metoprolol succinate (TOPROL-XL) 50 MG 24 hr tablet TAKE 1 TABLET BY MOUTH DAILY WITH OR IMMEDIATELY FOLLOWING A MEAL 04/22/21   Park Liter, MD  ?   ? ?Allergies    ?Patient has no known allergies.   ? ?Review of Systems   ?Review of Systems  ?Gastrointestinal:  Positive for abdominal pain.  ? ?Physical Exam ?Updated Vital Signs ?BP (!) 159/117 (BP Location: Left Arm)   Pulse 73   Temp 98.2 ?F (36.8 ?C) (Oral)   Resp 18   Ht 5\' 5"  (1.651 m)   Wt 86.2 kg   LMP  (LMP Unknown) Comment: states late for this month  SpO2 98%   BMI 31.62 kg/m?  ?Physical Exam ?Vitals and nursing note reviewed.  Exam conducted with a chaperone present.  ?Constitutional:   ?   Appearance: Normal appearance.  ?HENT:  ?   Head: Normocephalic and atraumatic.  ?Eyes:  ?   General: No scleral icterus.    ?   Right eye: No discharge.     ?   Left eye: No discharge.  ?   Extraocular Movements: Extraocular movements intact.  ?   Pupils: Pupils are equal, round, and reactive to light.  ?Cardiovascular:  ?   Rate and Rhythm: Normal rate and regular rhythm.  ?   Pulses: Normal pulses.  ?   Heart sounds: Normal heart sounds. No murmur heard. ?  No friction rub. No gallop.  ?Pulmonary:  ?   Effort: Pulmonary effort is normal. No respiratory distress.  ?   Breath sounds: Normal breath sounds.  ?Abdominal:  ?   General: Abdomen is flat. Bowel sounds are normal. There is no distension.  ?   Palpations: Abdomen is soft.  ?   Tenderness: There is generalized abdominal tenderness. There is guarding.  ?   Comments: Abdominal tenderness diffuse, voluntary guarding.   ?Skin: ?   General: Skin is warm and dry.  ?   Coloration: Skin is not jaundiced.  ?Neurological:  ?   Mental Status: She is alert. Mental status is at baseline.  ?   Coordination: Coordination normal.  ?  Comments: CN 3-12 grossly intact, no pronator drift, able to raise both upper and lower extremities without difficulty   ? ? ?ED Results / Procedures / Treatments   ?Labs ?(all labs ordered are listed, but only abnormal results are displayed) ?Labs Reviewed  ?LIPASE, BLOOD - Abnormal; Notable for the following components:  ?    Result Value  ? Lipase 486 (*)   ? All other components within normal limits  ?COMPREHENSIVE METABOLIC PANEL - Abnormal; Notable for the following components:  ? Sodium 133 (*)   ? Potassium 2.8 (*)   ? Chloride 92 (*)   ? Glucose, Bld 131 (*)   ? Total Protein 9.2 (*)   ? AST 75 (*)   ? Total Bilirubin 3.0 (*)   ? All other components within normal limits  ?CBC - Abnormal; Notable for the following components:  ? RBC 3.61 (*)   ? MCV 108.0 (*)   ? MCH  38.8 (*)   ? All other components within normal limits  ?URINALYSIS, ROUTINE W REFLEX MICROSCOPIC - Abnormal; Notable for the following components:  ? Color, Urine BROWN (*)   ? Hgb urine dipstick TRACE (*)   ? Bilirubin Urine MODERATE (*)   ? Ketones, ur 15 (*)   ? Protein, ur >=300 (*)   ? Nitrite POSITIVE (*)   ? All other components within normal limits  ?URINALYSIS, MICROSCOPIC (REFLEX) - Abnormal; Notable for the following components:  ? Bacteria, UA RARE (*)   ? All other components within normal limits  ?PREGNANCY, URINE  ?TROPONIN I (HIGH SENSITIVITY)  ? ? ?EKG ?None ? ?Radiology ?DG Chest 2 View ? ?Result Date: 07/23/2021 ?CLINICAL DATA:  Chest pain. EXAM: CHEST - 2 VIEW COMPARISON:  Chest radiograph 10/22/2018 FINDINGS: The heart size and mediastinal contours are within normal limits. Both lungs are clear. The visualized skeletal structures are unremarkable. IMPRESSION: No active cardiopulmonary disease. Electronically Signed   By: Markus Daft M.D.   On: 07/23/2021 13:01  ? ?CT Head Wo Contrast ? ?Result Date: 07/23/2021 ?CLINICAL DATA:  Head trauma EXAM: CT HEAD WITHOUT CONTRAST TECHNIQUE: Contiguous axial images were obtained from the base of the skull through the vertex without intravenous contrast. RADIATION DOSE REDUCTION: This exam was performed according to the departmental dose-optimization program which includes automated exposure control, adjustment of the mA and/or kV according to patient size and/or use of iterative reconstruction technique. COMPARISON:  None. FINDINGS: Brain: No evidence of acute infarction, hemorrhage, hydrocephalus, extra-axial collection or mass lesion/mass effect. Vascular: No hyperdense vessel or unexpected calcification. Skull: Normal. Negative for fracture or focal lesion. Sinuses/Orbits: No acute finding. Other: None. IMPRESSION: No acute intracranial abnormality. Electronically Signed   By: Yetta Glassman M.D.   On: 07/23/2021 15:57  ? ?CT Abdomen Pelvis W  Contrast ? ?Result Date: 07/23/2021 ?CLINICAL DATA:  Acute abdominal pain with vomiting for 3 days EXAM: CT ABDOMEN AND PELVIS WITH CONTRAST TECHNIQUE: Multidetector CT imaging of the abdomen and pelvis was performed using the standard protocol following bolus administration of intravenous contrast. RADIATION DOSE REDUCTION: This exam was performed according to the departmental dose-optimization program which includes automated exposure control, adjustment of the mA and/or kV according to patient size and/or use of iterative reconstruction technique. CONTRAST:  198mL OMNIPAQUE IOHEXOL 300 MG/ML  SOLN COMPARISON:  08/09/2016 right upper quadrant ultrasound FINDINGS: Lower chest: No acute abnormality. Hepatobiliary: Marked hypoattenuation of liver compatible with hepatic steatosis. Minor fatty sparing along gallbladder fossa. No hepatic abnormality or biliary obstruction  pattern. Hepatic and portal veins are patent. Mild gallbladder distension. Gallstones better demonstrated by comparison ultrasound. No surrounding gallbladder inflammatory process. Common bile duct is nondilated. Pancreas: Small punctate calcification in the pancreatic head, image 41/2. No ductal dilatation. Surrounding diffuse peripancreatic inflammatory changes and strandy edema throughout the retroperitoneal space in the upper abdomen compatible with uncomplicated acute pancreatitis. Pancreas continues to enhance normally. No fluid collection. No vascular complication. Spleen: Normal in size without focal abnormality. Adrenals/Urinary Tract: Adrenal glands are unremarkable. Kidneys are normal, without renal calculi, focal lesion, or hydronephrosis. Bladder is unremarkable. Stomach/Bowel: Negative for bowel obstruction, significant dilatation, ileus, or free air. Scattered colonic diverticulosis no acute inflammatory process. Normal appendix demonstrated. No free fluid, fluid collection, hemorrhage, hematoma, abscess or ascites. Vascular/Lymphatic:  Trace infrarenal aorta atherosclerosis. No aneurysm, dissection or acute vascular process. Mesenteric and renal vasculature all remain patent. No veno-occlusive finding. No bulky adenopathy. Reproductive: Uterus and adnexa

## 2021-07-23 NOTE — ED Notes (Signed)
States for the past few days has had abd pain with N/V, last vomiting episode was approx 30 min, unable to keep PO fluids down, has denied having a fever ?

## 2021-07-23 NOTE — ED Notes (Signed)
Called CT, urine preg is Negative ?

## 2021-07-23 NOTE — ED Notes (Signed)
Water given to see if pt can tolerate liquids ?

## 2021-07-23 NOTE — ED Notes (Signed)
Patient transported to CT 

## 2021-07-23 NOTE — Discharge Instructions (Signed)
Take the pain medicine as prescribed with the nausea medicine every 8 hours.  Take Keflex 4 times daily for 5 days.  Take the potassium supplements twice daily for the next week.  Follow-up with your primary in a few weeks for reevaluation.  Return to ED if unable to tolerate the pain or if unable to eat or drink without vomiting. ?

## 2021-07-23 NOTE — ED Triage Notes (Signed)
C/o vomiting x 3 days, generalized abdominal pain. States unable to keep anything down. States on Sunday she woke up on the floor 2 hours after being on the phone, unsure of events. States she believes she hit her head. Denies blood thinners. Also c/o left sided chest pain. ?

## 2021-07-24 ENCOUNTER — Telehealth (HOSPITAL_BASED_OUTPATIENT_CLINIC_OR_DEPARTMENT_OTHER): Payer: Self-pay | Admitting: Student

## 2021-07-24 DIAGNOSIS — K859 Acute pancreatitis without necrosis or infection, unspecified: Secondary | ICD-10-CM

## 2021-07-24 MED ORDER — OXYCODONE-ACETAMINOPHEN 10-325 MG PO TABS: 1.0000 | ORAL_TABLET | ORAL | 0 refills | Status: DC | PRN

## 2021-07-24 NOTE — Telephone Encounter (Signed)
Pain medicine on backorder.  Medicines changed. ?

## 2021-09-03 ENCOUNTER — Ambulatory Visit: Payer: BC Managed Care – PPO | Admitting: Cardiology

## 2022-01-18 ENCOUNTER — Other Ambulatory Visit: Payer: Self-pay | Admitting: Cardiology

## 2022-03-26 DIAGNOSIS — Z0000 Encounter for general adult medical examination without abnormal findings: Secondary | ICD-10-CM | POA: Diagnosis not present

## 2022-03-26 DIAGNOSIS — Z131 Encounter for screening for diabetes mellitus: Secondary | ICD-10-CM | POA: Diagnosis not present

## 2022-03-26 DIAGNOSIS — Z01419 Encounter for gynecological examination (general) (routine) without abnormal findings: Secondary | ICD-10-CM | POA: Diagnosis not present

## 2022-03-26 DIAGNOSIS — Z13220 Encounter for screening for lipoid disorders: Secondary | ICD-10-CM | POA: Diagnosis not present

## 2022-03-26 DIAGNOSIS — Z1159 Encounter for screening for other viral diseases: Secondary | ICD-10-CM | POA: Diagnosis not present

## 2022-04-26 ENCOUNTER — Other Ambulatory Visit: Payer: Self-pay | Admitting: Cardiology

## 2022-06-11 ENCOUNTER — Telehealth: Payer: Self-pay | Admitting: Cardiology

## 2022-06-11 ENCOUNTER — Other Ambulatory Visit: Payer: Self-pay

## 2022-06-11 MED ORDER — LOSARTAN POTASSIUM 50 MG PO TABS: 50.0000 mg | ORAL_TABLET | Freq: Every day | ORAL | 0 refills | Status: DC

## 2022-06-11 NOTE — Telephone Encounter (Signed)
Called patient and she reported that she has having skipped heart beats since December. Since December she has had two episodes of SOB and chest pain. She also reports that in the past she had an episode of chest tightness that went up into her neck and came back down into her chest. Last night she had an episode of chest pain that was relieved with rest, but she also had tingling and numbness in her arms. Currently she only feels the skipped heart beats and she stated that she forgot to take her metoprolol this past morning. I asked if she was able to take her blood pressure and she does not have a blood pressure device at home. Please advise.

## 2022-06-11 NOTE — Telephone Encounter (Signed)
*  STAT* If patient is at the pharmacy, call can be transferred to refill team.   1. Which medications need to be refilled? (please list name of each medication and dose if known) losartan (COZAAR) 50 MG tablet   2. Which pharmacy/location (including street and city if local pharmacy) is medication to be sent to?  [facility] [id number] - [place], [place] - [address] AT [address]   3. Do they need a 30 day or 90 day supply? 90 day  Patient has appt on 08/12/22

## 2022-06-11 NOTE — Telephone Encounter (Signed)
Patient c/o Palpitations:  High priority if patient c/o lightheadedness, shortness of breath, or chest pain  How long have you had palpitations/irregular HR/ Afib? Having palpitations since December Are you having the symptoms now? Patient states a little bit.   Are you currently experiencing lightheadedness, SOB or CP? no  Do you have a history of afib (atrial fibrillation) or irregular heart rhythm? Has history of palpitations.  Have you checked your BP or HR? (document readings if available): has not recently check, just back in December when she had a physical  Are you experiencing any other symptoms? no

## 2022-06-12 ENCOUNTER — Other Ambulatory Visit: Payer: Self-pay | Admitting: Cardiology

## 2022-06-12 NOTE — Telephone Encounter (Signed)
Called patient and informed her of Dr. Julien Nordmann recommendation below:  "Best to go to urgent care or emergency room ASAP for this evaluation."  Patient stated that she was feeling fine now and did not want to go to the ER. She asked if she could schedule a sooner appointment with Dr. Agustin Cree. An appointment was scheduled for her to see Dr. Agustin Cree on 3/5 at 8:20 am. Patient had no further questions at this time.

## 2022-06-17 ENCOUNTER — Ambulatory Visit: Payer: BC Managed Care – PPO | Admitting: Cardiology

## 2022-08-12 ENCOUNTER — Ambulatory Visit: Payer: BC Managed Care – PPO | Attending: Cardiology

## 2022-08-12 ENCOUNTER — Encounter: Payer: Self-pay | Admitting: Cardiology

## 2022-08-12 ENCOUNTER — Ambulatory Visit: Payer: BC Managed Care – PPO | Attending: Cardiology | Admitting: Cardiology

## 2022-08-12 VITALS — BP 160/120 | HR 73 | Ht 64.0 in | Wt 188.0 lb

## 2022-08-12 DIAGNOSIS — I341 Nonrheumatic mitral (valve) prolapse: Secondary | ICD-10-CM

## 2022-08-12 DIAGNOSIS — R0609 Other forms of dyspnea: Secondary | ICD-10-CM

## 2022-08-12 DIAGNOSIS — R002 Palpitations: Secondary | ICD-10-CM

## 2022-08-12 DIAGNOSIS — I480 Paroxysmal atrial fibrillation: Secondary | ICD-10-CM | POA: Diagnosis not present

## 2022-08-12 DIAGNOSIS — I10 Essential (primary) hypertension: Secondary | ICD-10-CM | POA: Diagnosis not present

## 2022-08-12 NOTE — Progress Notes (Unsigned)
Cardiology Office Note:    Date:  08/12/2022   ID:  Allison Pope, DOB Jan 23, 1986, MRN [id number]  PCP:  Patient, No Pcp Per  Cardiologist:  Allison Balsam, MD    Referring MD: No ref. provider found   Chief Complaint  Patient presents with   Atrial Fibrillation         History of Present Illness:    Allison Pope is a 37 y.o. female with past medical history significant for 1 documented episode of atrial fibrillation, history of mitral valve prolapse however echocardiogram in 2019 did not show that she does have some history of EtOH abuse and essential hypertension.  Comes today to my office for follow-up.  She did have difficult issues going on right now at home and family she does not want to talk to me about this but tells me that she went to graft course.  She is complaining of having palpitations more often right now.  Those episodes forceful strong beating of her heart and sometimes she said she feels she had atrial fibrillation.  Past Medical History:  Diagnosis Date   Acute pancreatitis without infection or necrosis 08/09/2016   AF (paroxysmal atrial fibrillation) (HCC) 08/09/2016   Alcohol abuse 08/12/2016   Atrial fibrillation (HCC)    Hypokalemia 08/09/2016   MVP (mitral valve prolapse)    Palpitations 05/28/2017   Routine health maintenance 07/04/2013   Snoring 05/28/2017   Syncope and collapse 08/11/2018    Past Surgical History:  Procedure Laterality Date   No past surgery      Current Medications: Current Meds  Medication Sig   losartan (COZAAR) 50 MG tablet Take 1 tablet (50 mg total) by mouth daily. Patient needs to keep 08-12-22 appt for more refills   metoprolol succinate (TOPROL-XL) 50 MG 24 hr tablet TAKE 1 TABLET BY MOUTH EVERY DAY WITH OR IMMEDIATELY FOLLOWING A MEAL (Patient taking differently: Take 50 mg by mouth daily. TAKE 1 TABLET BY MOUTH EVERY DAY WITH OR IMMEDIATELY FOLLOWING A MEAL)   [DISCONTINUED] cephALEXin (KEFLEX) 500 MG capsule Take 1 capsule  (500 mg total) by mouth 4 (four) times daily.   [DISCONTINUED] ondansetron (ZOFRAN-ODT) 8 MG disintegrating tablet Take 1 tablet (8 mg total) by mouth every 8 (eight) hours as needed. (Patient taking differently: Take 8 mg by mouth every 8 (eight) hours as needed for nausea or vomiting.)   [DISCONTINUED] oxyCODONE-acetaminophen (PERCOCET) 10-325 MG tablet Take 1 tablet by mouth every 4 (four) hours as needed for pain.   [DISCONTINUED] potassium chloride SA (KLOR-CON M) 20 MEQ tablet Take 1 tablet (20 mEq total) by mouth 2 (two) times daily.     Allergies:   Patient has no known allergies.   Social History   Socioeconomic History   Marital status: Single    Spouse name: Not on file   Number of children: Not on file   Years of education: Not on file   Highest education level: Not on file  Occupational History   Not on file  Tobacco Use   Smoking status: Never   Smokeless tobacco: Never  Vaping Use   Vaping Use: Never used  Substance and Sexual Activity   Alcohol use: Yes    Comment: occ   Drug use: No   Sexual activity: Not on file  Other Topics Concern   Not on file  Social History Narrative   Not on file   Social Determinants of Health   Financial Resource Strain: Not on file  Food Insecurity: Not on file  Transportation Needs: Not on file  Physical Activity: Not on file  Stress: Not on file  Social Connections: Not on file     Family History: The patient's family history includes Atrial fibrillation in her father; Congestive Heart Failure in her paternal grandmother; Healthy in her mother. ROS:   Please see the history of present illness.    All 14 point review of systems negative except as described per history of present illness  EKGs/Labs/Other Studies Reviewed:      Recent Labs: No results found for requested labs within last 365 days.  Recent Lipid Panel    Component Value Date/Time   CHOL 197 02/18/2021 1559   TRIG 158 (H) 02/18/2021 1559   HDL 63  02/18/2021 1559   CHOLHDL 3.1 02/18/2021 1559   CHOLHDL 2.4 08/10/2016 0744   VLDL 21 08/10/2016 0744   LDLCALC 107 (H) 02/18/2021 1559    Physical Exam:    VS:  BP (!) 160/120 (BP Location: Left Arm, Patient Position: Sitting)   Pulse 73   Ht 5\' 4"  (1.626 m)   Wt 188 lb (85.3 kg)   SpO2 95%   BMI 32.27 kg/m     Wt Readings from Last 3 Encounters:  08/12/22 188 lb (85.3 kg)  07/23/21 190 lb (86.2 kg)  02/18/21 189 lb (85.7 kg)     GEN:  Well nourished, well developed in no acute distress HEENT: Normal NECK: No JVD; No carotid bruits LYMPHATICS: No lymphadenopathy CARDIAC: RRR, no murmurs, no rubs, no gallops RESPIRATORY:  Clear to auscultation without rales, wheezing or rhonchi  ABDOMEN: Soft, non-tender, non-distended MUSCULOSKELETAL:  No edema; No deformity  SKIN: Warm and dry LOWER EXTREMITIES: no swelling NEUROLOGIC:  Alert and oriented x 3 PSYCHIATRIC:  Normal affect   ASSESSMENT:    1. Essential hypertension   2. MVP (mitral valve prolapse)   3. AF (paroxysmal atrial fibrillation) (HCC)   4. Palpitations   5. Dyspnea on exertion    PLAN:    In order of problems listed above:  Essential hypertension clearly uncontrolled I will do Chem-7 to see if we can adjust her medications. History of mitral valve prolapse will repeat echocardiogram I am repeating echocardiogram also 44 but she does have some swelling of lower extremities I am more about potentially having some cardiomyopathy especially related to her EtOH abuse. Dyspnea on exertion, echocardiogram will be done Paroxysmal atrial fibrillation: Zio patch will be placed to see if we dealing with atrial fibrillation truly   Medication Adjustments/Labs and Tests Ordered: Current medicines are reviewed at length with the patient today.  Concerns regarding medicines are outlined above.  Orders Placed This Encounter  Procedures   Basic metabolic panel   LONG TERM MONITOR (3-14 DAYS)   EKG 12-Lead    ECHOCARDIOGRAM COMPLETE   Medication changes: No orders of the defined types were placed in this encounter.   Signed, Allison Lea, MD, [facility] 08/12/2022 4:43 PM    [facility]

## 2022-08-12 NOTE — Patient Instructions (Addendum)
Medication Instructions:  Your physician recommends that you continue on your current medications as directed. Please refer to the Current Medication list given to you today.  *If you need a refill on your cardiac medications before your next appointment, please call your pharmacy*   Lab Work: 3rd Floor   [address]  Your physician recommends that you return for lab work in:    You need to have labs done when you are fasting.  You can come Monday through Friday 8:00 am to 11:30 am and 1:00 to 4:00. You do not need to make an appointment as the order has already been placed. The labs you are going to have done are BMP      Testing/Procedures: Your physician has requested that you have an echocardiogram. Echocardiography is a painless test that uses sound waves to create images of your heart. It provides your doctor with information about the size and shape of your heart and how well your heart's chambers and valves are working. This procedure takes approximately one hour. There are no restrictions for this procedure. Please do NOT wear cologne, perfume, aftershave, or lotions (deodorant is allowed). Please arrive 15 minutes prior to your appointment time.   WHY IS MY DOCTOR PRESCRIBING ZIO? The Zio system is proven and trusted by physicians to detect and diagnose irregular heart rhythms -- and has been prescribed to hundreds of thousands of patients.  The FDA has cleared the Zio system to monitor for many different kinds of irregular heart rhythms. In a study, physicians were able to reach a diagnosis 90% of the time with the Zio system1.  You can wear the Zio monitor -- a small, discreet, comfortable patch -- during your normal day-to-day activity, including while you sleep, shower, and exercise, while it records every single heartbeat for analysis.  1Barrett, P., et al. Comparison of 24 Hour Holter Monitoring Versus 14 Day Novel Adhesive Patch Electrocardiographic Monitoring. American  Journal of Medicine, 2014.  ZIO VS. HOLTER MONITORING The Zio monitor can be comfortably worn for up to 14 days. Holter monitors can be worn for 24 to 48 hours, limiting the time to record any irregular heart rhythms you may have. Zio is able to capture data for the 51% of patients who have their first symptom-triggered arrhythmia after 48 hours.1  LIVE WITHOUT RESTRICTIONS The Zio ambulatory cardiac monitor is a small, unobtrusive, and water-resistant patch--you might even forget you're wearing it. The Zio monitor records and stores every beat of your heart, whether you're sleeping, working out, or showering.      Follow-Up: At [facility], you and your health needs are our priority.  As part of our continuing mission to provide you with exceptional heart care, we have created designated Provider Care Teams.  These Care Teams include your primary Cardiologist (physician) and Advanced Practice Providers (APPs -  Physician Assistants and Nurse Practitioners) who all work together to provide you with the care you need, when you need it.  We recommend signing up for the patient portal called "MyChart".  Sign up information is provided on this After Visit Summary.  MyChart is used to connect with patients for Virtual Visits (Telemedicine).  Patients are able to view lab/test results, encounter notes, upcoming appointments, etc.  Non-urgent messages can be sent to your provider as well.   To learn more about what you can do with MyChart, go to [email or web address].    Your next appointment:   2 month(s)  The format for your next  appointment:   In Person  Provider:   Gypsy Balsam, MD    Other Instructions NA

## 2022-08-18 ENCOUNTER — Ambulatory Visit: Payer: BC Managed Care – PPO | Admitting: Cardiology

## 2022-08-29 DIAGNOSIS — R002 Palpitations: Secondary | ICD-10-CM | POA: Diagnosis not present

## 2022-09-09 ENCOUNTER — Other Ambulatory Visit: Payer: Self-pay | Admitting: Cardiology

## 2022-09-09 NOTE — Telephone Encounter (Signed)
Rx refill sent to pharmacy. 

## 2022-09-10 ENCOUNTER — Ambulatory Visit (HOSPITAL_BASED_OUTPATIENT_CLINIC_OR_DEPARTMENT_OTHER): Payer: BC Managed Care – PPO

## 2022-09-10 ENCOUNTER — Telehealth: Payer: Self-pay

## 2022-09-10 NOTE — Telephone Encounter (Signed)
Left message on My Chart with normal results per Dr. Krasowski's note. Routed to PCP. 

## 2022-10-20 IMAGING — CT CT HEAD W/O CM
3 series · 14 of 47 positions shown, 16 images · non-contrast
Comparison: None.

CLINICAL DATA: Head trauma



[Series 2: head wo · axial · 0.51mm/px · z∈[+1066,+1196]mm · 8 of 32 slices shown, 10 images]
[im 3/32  brain]
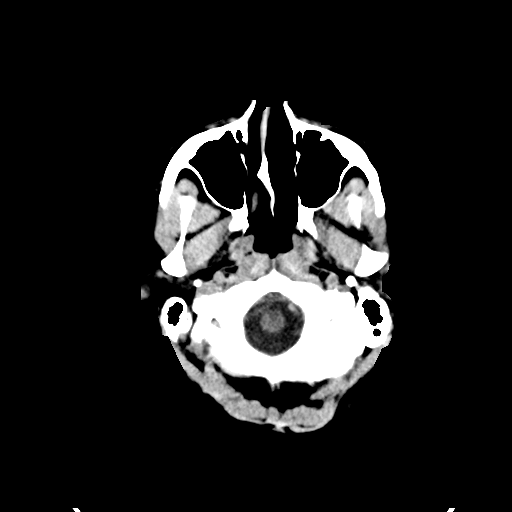
[im 3/32  bone]
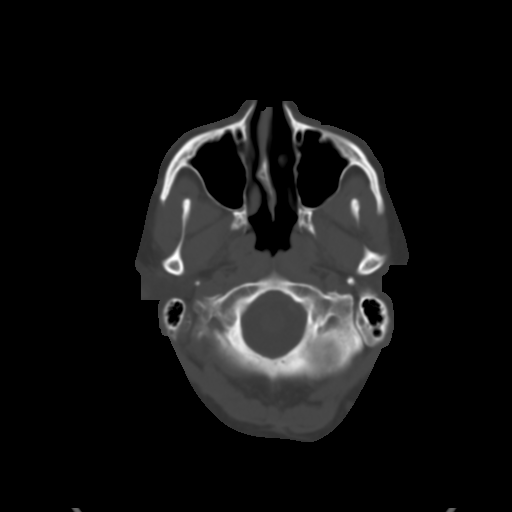
[im 7/32  brain]
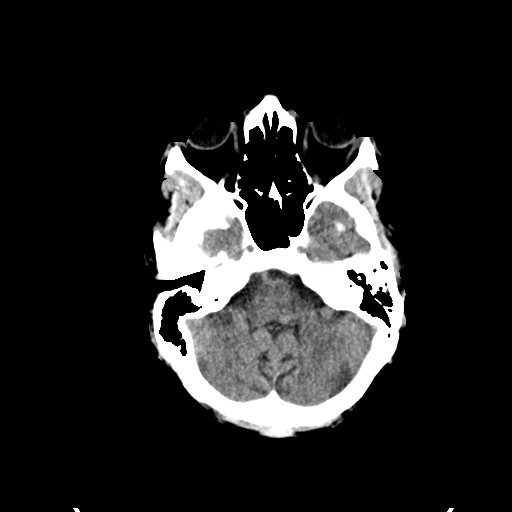
[im 10/32  brain]
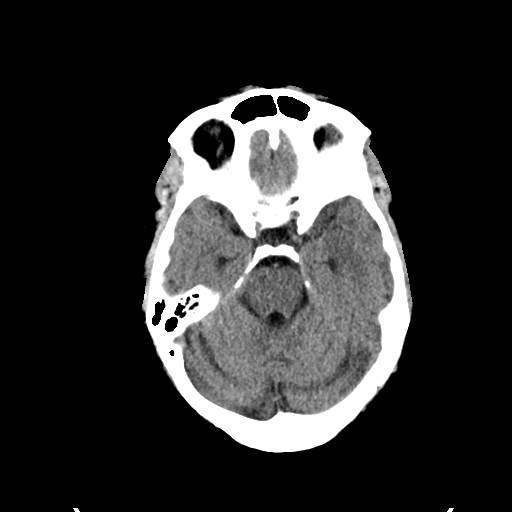
[im 14/32  brain]
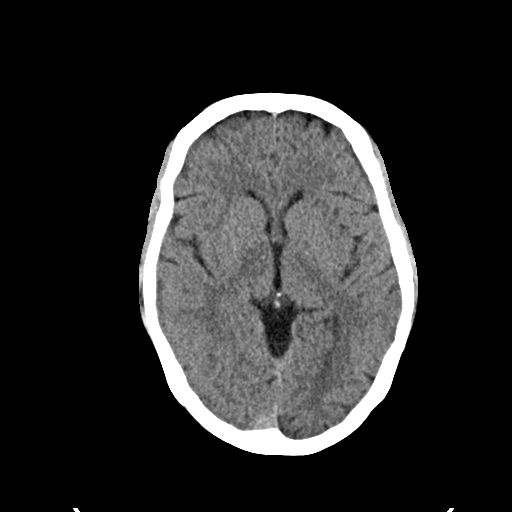
[im 18/32  brain]
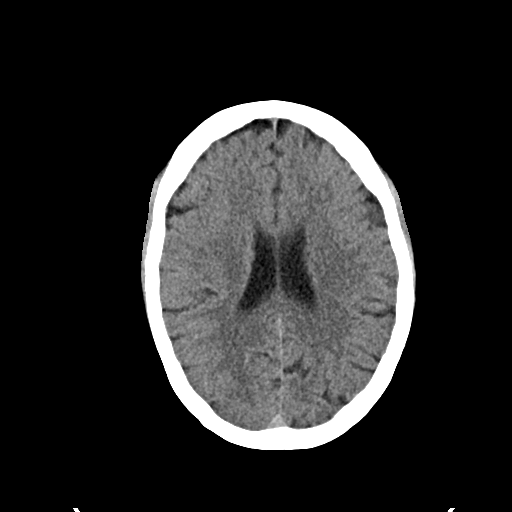
[im 18/32  bone]
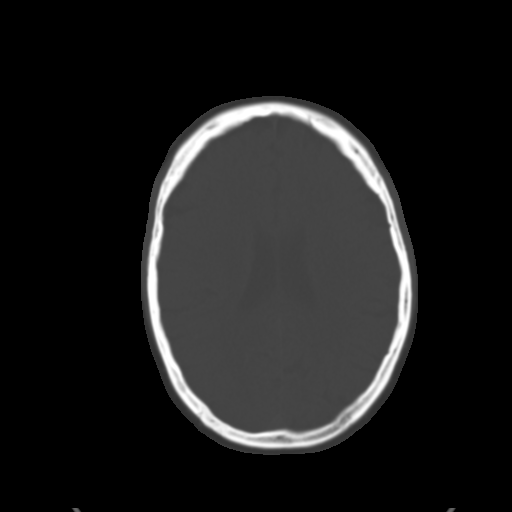
[im 22/32  brain]
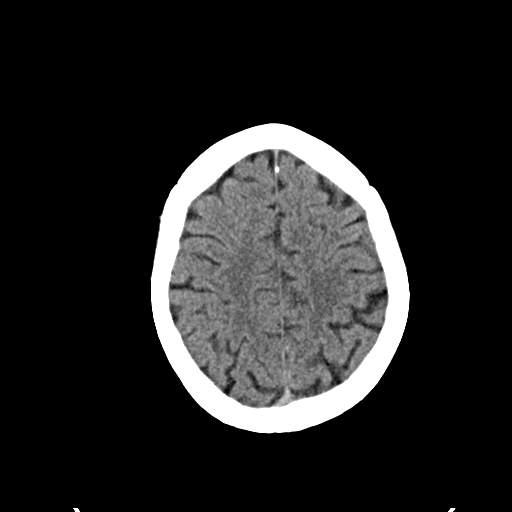
[im 25/32  brain]
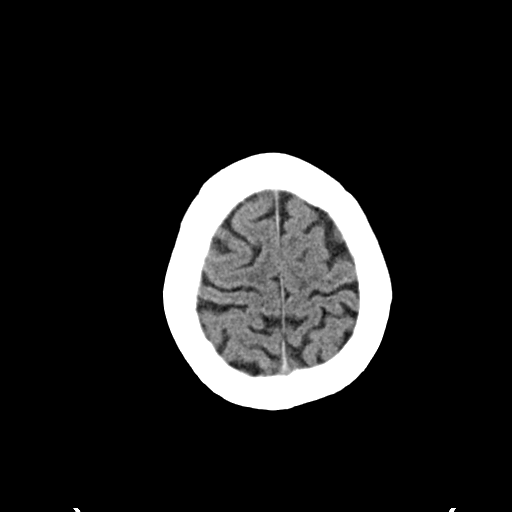
[im 29/32  brain]
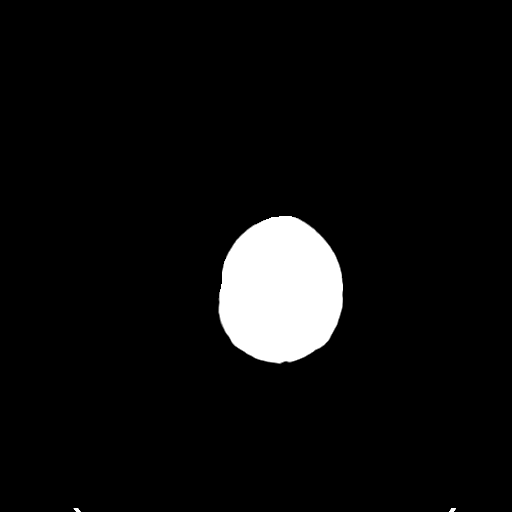

[Series 4: coronal soft · coronal · 0.30mm/px · 3 of 70 slices shown]
[im 24/70  brain]
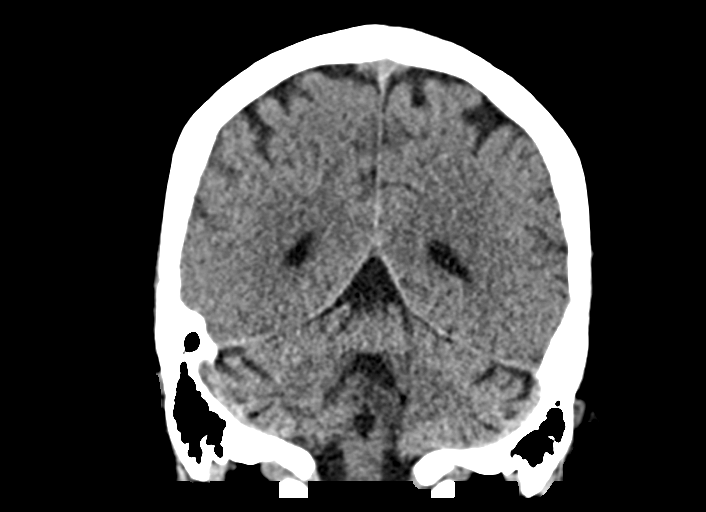
[im 31/70  brain]
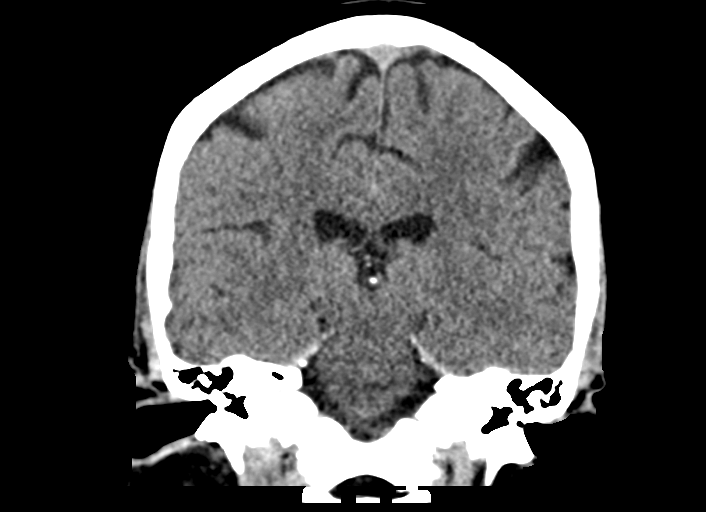
[im 39/70  brain]
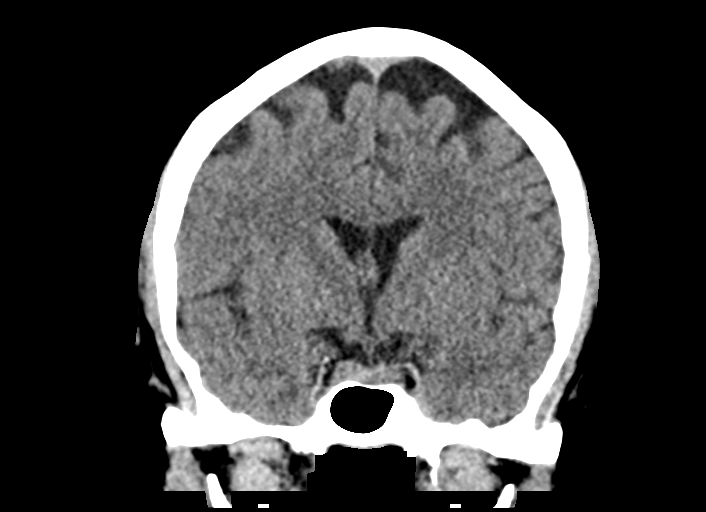

[Series 5: sag soft · sagittal · 0.30mm/px · 3 of 67 slices shown]
[im 23/67  brain]
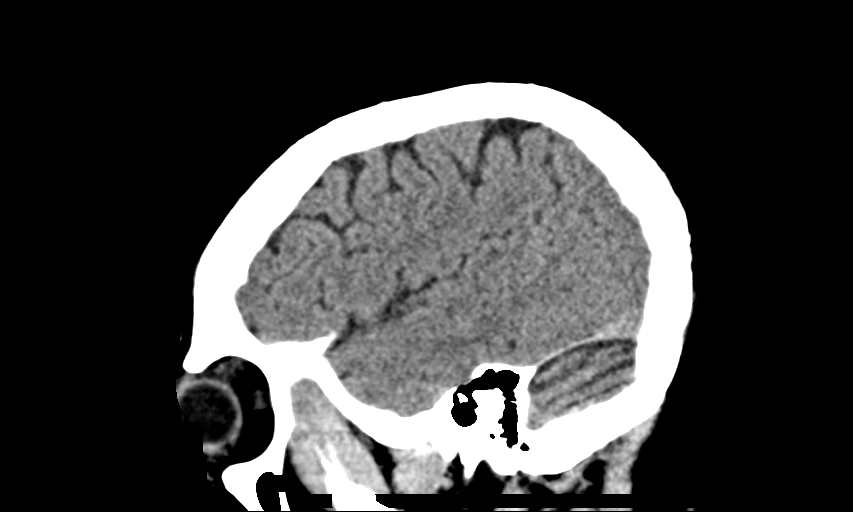
[im 34/67  brain]
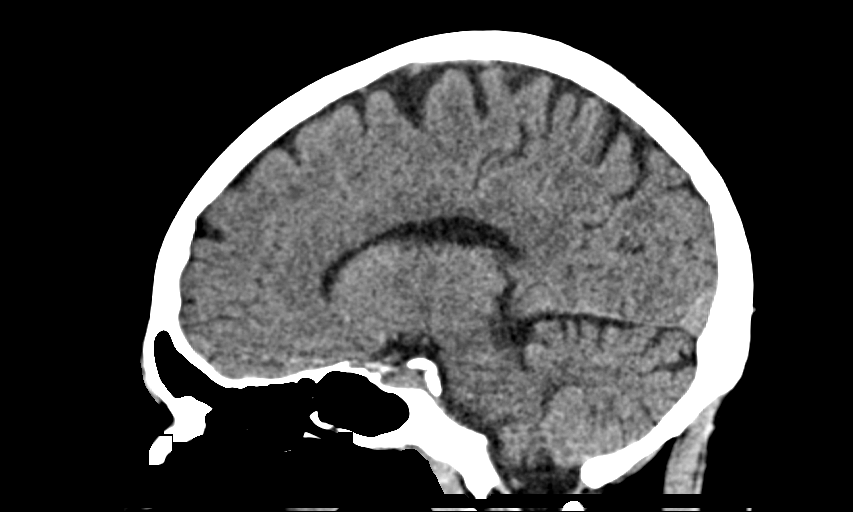
[im 45/67  brain]
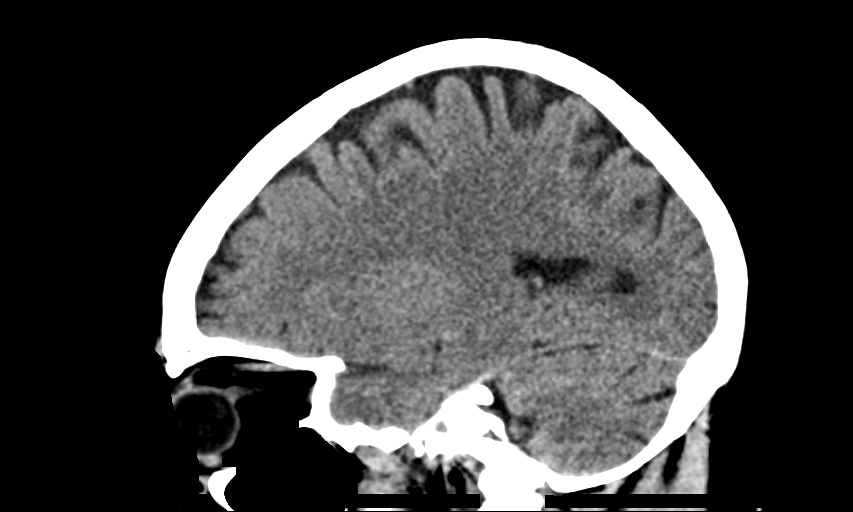

[14 of 47 positions shown; findings below may reference images not displayed]

FINDINGS: Brain: No evidence of acute infarction, hemorrhage, hydrocephalus,
extra-axial collection or mass lesion/mass effect.

Vascular: No hyperdense vessel or unexpected calcification.

Skull: Normal. Negative for fracture or focal lesion.

Sinuses/Orbits: No acute finding.

Other: None.
IMPRESSION: No acute intracranial abnormality.

## 2022-11-07 ENCOUNTER — Ambulatory Visit: Payer: BC Managed Care – PPO | Admitting: Cardiology

## 2022-11-26 ENCOUNTER — Other Ambulatory Visit: Payer: Self-pay | Admitting: Cardiology

## 2022-12-30 ENCOUNTER — Ambulatory Visit: Payer: BC Managed Care – PPO | Attending: Cardiology | Admitting: Cardiology

## 2022-12-30 ENCOUNTER — Encounter: Payer: Self-pay | Admitting: Cardiology

## 2022-12-30 VITALS — BP 106/72 | HR 75 | Ht 65.0 in | Wt 191.0 lb

## 2022-12-30 DIAGNOSIS — I341 Nonrheumatic mitral (valve) prolapse: Secondary | ICD-10-CM | POA: Diagnosis not present

## 2022-12-30 DIAGNOSIS — I10 Essential (primary) hypertension: Secondary | ICD-10-CM | POA: Diagnosis not present

## 2022-12-30 DIAGNOSIS — I480 Paroxysmal atrial fibrillation: Secondary | ICD-10-CM

## 2022-12-30 DIAGNOSIS — R002 Palpitations: Secondary | ICD-10-CM | POA: Diagnosis not present

## 2022-12-30 NOTE — Patient Instructions (Signed)

## 2022-12-30 NOTE — Progress Notes (Signed)
Cardiology Office Note:    Date:  12/30/2022   ID:  Allison Pope, DOB 1986-01-14, MRN [id number]  PCP:  Patient, No Pcp Per  Cardiologist:  Gypsy Balsam, MD    Referring MD: No ref. provider found   Chief Complaint  Patient presents with   Follow-up    History of Present Illness:    Allison Pope is a 37 y.o. female with past medical history significant for 1 documented episode of atrial fibrillation and had a long time ago, history of mitral valve prolapse however echocardiogram did not confirm left also history of EtOH abuse, essential hypertension. She comes today to months for follow-up.  Last time she wore monitor because she was having some palpitations.  We did review monitor together which showed some PVCs to be symptomatic but she said it does not bother her much.  She was scheduled to have echocardiogram as well but that is very expensive there is 1500 hrs. for her so she did not have it done.  Likely things are looking much better.  There is swelling of lower extremities only minimal.  And she is doing well  Past Medical History:  Diagnosis Date   Acute pancreatitis without infection or necrosis 08/09/2016   AF (paroxysmal atrial fibrillation) (HCC) 08/09/2016   Alcohol abuse 08/12/2016   Atrial fibrillation (HCC)    Hypokalemia 08/09/2016   MVP (mitral valve prolapse)    Palpitations 05/28/2017   Routine health maintenance 07/04/2013   Snoring 05/28/2017   Syncope and collapse 08/11/2018    Past Surgical History:  Procedure Laterality Date   No past surgery      Current Medications: Current Meds  Medication Sig   losartan (COZAAR) 50 MG tablet Take 1 tablet (50 mg total) by mouth daily.   metoprolol succinate (TOPROL-XL) 50 MG 24 hr tablet TAKE 1 TABLET BY MOUTH EVERY DAY WITH OR IMMEDIATELY FOLLOWING A MEAL (Patient taking differently: Take 50 mg by mouth daily. TAKE 1 TABLET BY MOUTH EVERY DAY WITH OR IMMEDIATELY FOLLOWING A MEAL)     Allergies:   Patient has no  known allergies.   Social History   Socioeconomic History   Marital status: Single    Spouse name: Not on file   Number of children: Not on file   Years of education: Not on file   Highest education level: Not on file  Occupational History   Not on file  Tobacco Use   Smoking status: Never   Smokeless tobacco: Never  Vaping Use   Vaping status: Never Used  Substance and Sexual Activity   Alcohol use: Yes    Comment: occ   Drug use: No   Sexual activity: Not on file  Other Topics Concern   Not on file  Social History Narrative   Not on file   Social Determinants of Health   Financial Resource Strain: Not on file  Food Insecurity: Low Risk  (03/26/2022)   Received from [facility] visits prior to 06/14/2022., [facility] visits prior to 06/14/2022.   Food    Within the past 12 months, you worried that your food would run out before you got money to buy more food: Never true    Within the past 12 months, the food you bought just didn't last and you didn't have money to get more: Never true  Transportation Needs: No Transportation Needs (03/26/2022)   Received from [facility] visits prior to  06/14/2022., [facility] visits prior to 06/14/2022.   Transportation    In the past 12 months, has lack of reliable transportation kept you from medical appointments, meetings, work or from getting things needed for daily living?: No  Physical Activity: Not on file  Stress: Not on file  Social Connections: Not on file     Family History: The patient's family history includes Atrial fibrillation in her father; Congestive Heart Failure in her paternal grandmother; Healthy in her mother. ROS:   Please see the history of present illness.    All 14 point review of systems negative except as described per history of present illness  EKGs/Labs/Other Studies Reviewed:         Recent Labs: No results found  for requested labs within last 365 days.  Recent Lipid Panel    Component Value Date/Time   CHOL 197 02/18/2021 1559   TRIG 158 (H) 02/18/2021 1559   HDL 63 02/18/2021 1559   CHOLHDL 3.1 02/18/2021 1559   CHOLHDL 2.4 08/10/2016 0744   VLDL 21 08/10/2016 0744   LDLCALC 107 (H) 02/18/2021 1559    Physical Exam:    VS:  BP 106/72 (BP Location: Left Arm, Patient Position: Sitting)   Pulse 75   Ht 5\' 5"  (1.651 m)   Wt 191 lb (86.6 kg)   SpO2 93%   BMI 31.78 kg/m     Wt Readings from Last 3 Encounters:  12/30/22 191 lb (86.6 kg)  08/12/22 188 lb (85.3 kg)  07/23/21 190 lb (86.2 kg)     GEN:  Well nourished, well developed in no acute distress HEENT: Normal NECK: No JVD; No carotid bruits LYMPHATICS: No lymphadenopathy CARDIAC: RRR, no murmurs, no rubs, no gallops RESPIRATORY:  Clear to auscultation without rales, wheezing or rhonchi  ABDOMEN: Soft, non-tender, non-distended MUSCULOSKELETAL:  No edema; No deformity  SKIN: Warm and dry LOWER EXTREMITIES: no swelling NEUROLOGIC:  Alert and oriented x 3 PSYCHIATRIC:  Normal affect   ASSESSMENT:    1. AF (paroxysmal atrial fibrillation) (HCC)   2. Essential hypertension   3. MVP (mitral valve prolapse)   4. Palpitations    PLAN:    In order of problems listed above:  Atrial fibrillation only 1 documented episode, CHADS2 Vascor equals 2.  Not anticoagulated. Essential hypertension blood pressure well-controlled. Mitral valve prolapse no documentation of this on last echocardiogram. Palpitations only PVCs doing well   Medication Adjustments/Labs and Tests Ordered: Current medicines are reviewed at length with the patient today.  Concerns regarding medicines are outlined above.  No orders of the defined types were placed in this encounter.  Medication changes: No orders of the defined types were placed in this encounter.   Signed, Georgeanna Lea, MD, [facility] 12/30/2022 10:54 AM    [facility]

## 2023-01-30 DIAGNOSIS — R55 Syncope and collapse: Secondary | ICD-10-CM | POA: Diagnosis not present

## 2023-01-30 DIAGNOSIS — R42 Dizziness and giddiness: Secondary | ICD-10-CM | POA: Diagnosis not present

## 2023-01-30 DIAGNOSIS — I9589 Other hypotension: Secondary | ICD-10-CM | POA: Diagnosis not present

## 2023-06-03 ENCOUNTER — Other Ambulatory Visit: Payer: Self-pay | Admitting: Cardiology

## 2023-10-22 ENCOUNTER — Other Ambulatory Visit: Payer: Self-pay | Admitting: Cardiology

## 2023-10-23 ENCOUNTER — Other Ambulatory Visit: Payer: Self-pay

## 2023-10-23 ENCOUNTER — Telehealth: Payer: Self-pay | Admitting: Cardiology

## 2023-10-23 MED ORDER — LOSARTAN POTASSIUM 50 MG PO TABS: 50.0000 mg | ORAL_TABLET | Freq: Every day | ORAL | 0 refills | Status: DC

## 2023-10-23 NOTE — Telephone Encounter (Signed)
 Pt c/o medication issue:  1. Name of Medication:           losartan  (COZAAR ) 50 MG tablet     2. How are you currently taking this medication (dosage and times per day)?  TAKE 1 TABLET(50 MG) BY MOUTH DAILY      3. Are you having a reaction (difficulty breathing--STAT)? No  4. What is your medication issue? Pt is requesting a callback regarding her wanting to know why her refill request for this medication was denied by MD. She stated she was informed by the pharmacy but she has one pill left and is confused. She'd like to speak with a nurse to see about it. Please advise

## 2023-11-02 ENCOUNTER — Encounter: Payer: Self-pay | Admitting: Cardiology

## 2023-11-30 ENCOUNTER — Telehealth: Payer: Self-pay | Admitting: *Deleted

## 2023-11-30 MED ORDER — METOPROLOL SUCCINATE ER 50 MG PO TB24: ORAL_TABLET | ORAL | 0 refills | Status: DC

## 2023-11-30 NOTE — Telephone Encounter (Signed)
 Rx refill sent to pharmacy.

## 2023-12-02 ENCOUNTER — Telehealth: Payer: Self-pay | Admitting: Cardiology

## 2023-12-02 NOTE — Telephone Encounter (Signed)
 Patient is needing a refill on her metoprolol  and losartan  before her next appointment in October. CB [phone]

## 2023-12-02 NOTE — Telephone Encounter (Signed)
 RX's both recently refilled.

## 2023-12-22 ENCOUNTER — Ambulatory Visit: Admitting: Cardiology

## 2024-01-19 ENCOUNTER — Other Ambulatory Visit: Payer: Self-pay | Admitting: Cardiology

## 2024-02-11 ENCOUNTER — Encounter: Payer: Self-pay | Admitting: Cardiology

## 2024-02-11 ENCOUNTER — Ambulatory Visit: Attending: Cardiology | Admitting: Cardiology

## 2024-02-11 VITALS — BP 140/102 | HR 94 | Ht 65.0 in | Wt 181.0 lb

## 2024-02-11 DIAGNOSIS — I480 Paroxysmal atrial fibrillation: Secondary | ICD-10-CM

## 2024-02-11 DIAGNOSIS — I341 Nonrheumatic mitral (valve) prolapse: Secondary | ICD-10-CM | POA: Diagnosis not present

## 2024-02-11 DIAGNOSIS — R002 Palpitations: Secondary | ICD-10-CM | POA: Diagnosis not present

## 2024-02-11 DIAGNOSIS — I10 Essential (primary) hypertension: Secondary | ICD-10-CM | POA: Diagnosis not present

## 2024-02-11 NOTE — Progress Notes (Signed)
 Cardiology Office Note:    Date:  02/11/2024   ID:  Allison Pope, DOB 1985/09/02, MRN [id number]  PCP:  Patient, No Pcp Per  Cardiologist:  Lamar Fitch, MD    Referring MD: No ref. provider found   Chief Complaint  Patient presents with   Follow-up    History of Present Illness:    Allison Pope is a 38 y.o. female past medical history significant for 1 documented episode of atrial fibrillation a few years ago, EtOH abuse, mitral valve prolapse but last echocardiogram did not confirm that, essential hypertension.  Recently she ended up having COVID after COVID she had some episode of palpitations some sustained lasting for couple hours she felt poorly during those episodes she was weak tired exhausted and also had some left arm pain.  Otherwise she is doing fine.  Gradually recovering from COVID she said this COVID was not that bad.  Past Medical History:  Diagnosis Date   Acute pancreatitis without infection or necrosis 08/09/2016   AF (paroxysmal atrial fibrillation) (HCC) 08/09/2016   Alcohol abuse 08/12/2016   Atrial fibrillation (HCC)    Hypokalemia 08/09/2016   MVP (mitral valve prolapse)    Palpitations 05/28/2017   Routine health maintenance 07/04/2013   Snoring 05/28/2017   Syncope and collapse 08/11/2018    Past Surgical History:  Procedure Laterality Date   No past surgery      Current Medications: Current Meds  Medication Sig   losartan  (COZAAR ) 50 MG tablet Take 1 tablet (50 mg total) by mouth daily. Patient must keep appointment on 02/11/24 for further refills. 1 st attempt   metoprolol  succinate (TOPROL -XL) 50 MG 24 hr tablet TAKE 1 TABLET BY MOUTH EVERY DAY WITH OR IMMEDIATELY FOLLOWING A MEAL     Allergies:   Patient has no known allergies.   Social History   Socioeconomic History   Marital status: Single    Spouse name: Not on file   Number of children: Not on file   Years of education: Not on file   Highest education level: Not on file   Occupational History   Not on file  Tobacco Use   Smoking status: Never   Smokeless tobacco: Never  Vaping Use   Vaping status: Never Used  Substance and Sexual Activity   Alcohol use: Yes    Comment: occ   Drug use: No   Sexual activity: Not on file  Other Topics Concern   Not on file  Social History Narrative   Not on file   Social Drivers of Health   Financial Resource Strain: Not on file  Food Insecurity: Low Risk  (03/26/2022)   [facility]   Hunger Vital Sign    Within the past 12 months, you worried that your food would run out before you got money to buy more: Never true    Within the past 12 months, the food you bought just didn't last and you didn't have money to get more: Not on file  Transportation Needs: Not on file (03/26/2022)  Physical Activity: Not on file  Stress: Not on file  Social Connections: Not on file     Family History: The patient's family history includes Atrial fibrillation in her father; Congestive Heart Failure in her paternal grandmother; Healthy in her mother. ROS:   Please see the history of present illness.    All 14 point review of systems negative except as described per history of present illness  EKGs/Labs/Other Studies  Reviewed:    EKG Interpretation Date/Time:  Thursday February 11 2024 15:43:22 EDT Ventricular Rate:  94 PR Interval:  228 QRS Duration:  84 QT Interval:  376 QTC Calculation: 470 R Axis:   17  Text Interpretation: Sinus rhythm with 1st degree A-V block Cannot rule out Anterior infarct (cited on or before 23-Jul-2021) When compared with ECG of 23-Jul-2021 12:45, Sinus rhythm has replaced Junctional rhythm QRS voltage has decreased Nonspecific T wave abnormality has replaced inverted T waves in Inferior leads Confirmed by Bernie Charleston [phone]) on 02/11/2024 3:46:03 PM    Recent Labs: No results found for requested labs within last 365 days.  Recent Lipid Panel    Component Value Date/Time    CHOL 197 02/18/2021 1559   TRIG 158 (H) 02/18/2021 1559   HDL 63 02/18/2021 1559   CHOLHDL 3.1 02/18/2021 1559   CHOLHDL 2.4 08/10/2016 0744   VLDL 21 08/10/2016 0744   LDLCALC 107 (H) 02/18/2021 1559    Physical Exam:    VS:  BP (!) 140/102   Pulse 94   Ht 5' 5 (1.651 m)   Wt 181 lb (82.1 kg)   SpO2 97%   BMI 30.12 kg/m     Wt Readings from Last 3 Encounters:  02/11/24 181 lb (82.1 kg)  12/30/22 191 lb (86.6 kg)  08/12/22 188 lb (85.3 kg)     GEN:  Well nourished, well developed in no acute distress HEENT: Normal NECK: No JVD; No carotid bruits LYMPHATICS: No lymphadenopathy CARDIAC: RRR, no murmurs, no rubs, no gallops RESPIRATORY:  Clear to auscultation without rales, wheezing or rhonchi  ABDOMEN: Soft, non-tender, non-distended MUSCULOSKELETAL:  No edema; No deformity  SKIN: Warm and dry LOWER EXTREMITIES: no swelling NEUROLOGIC:  Alert and oriented x 3 PSYCHIATRIC:  Normal affect   ASSESSMENT:    1. AF (paroxysmal atrial fibrillation) (HCC)   2. Essential hypertension   3. MVP (mitral valve prolapse)   4. Palpitations    PLAN:    In order of problems listed above:  Atrial fibrillation paroxysmal.  She does have some palpitations I want a put monitor on her but she does not want to do it she still on some manage from the first monitor, therefore, I ask her to get Kardia mobile record palpitation if she have palpitations.  I also offered her echocardiogram she does not want to do it for the same reason.  Hopefully things will get better after she fully recovered from COVID. Essential hypertension elevated but she is very upset about the situation today.  Will continue monitoring. Mitral valve prolapse last echocardiogram did not show that.   Medication Adjustments/Labs and Tests Ordered: Current medicines are reviewed at length with the patient today.  Concerns regarding medicines are outlined above.  Orders Placed This Encounter  Procedures   EKG  12-Lead   Medication changes: No orders of the defined types were placed in this encounter.   Signed, [place] DOROTHA Bernie, MD, [facility] 02/11/2024 4:00 PM    [facility]

## 2024-02-11 NOTE — Patient Instructions (Signed)
 Medication Instructions:  Your physician recommends that you continue on your current medications as directed. Please refer to the Current Medication list given to you today.  *If you need a refill on your cardiac medications before your next appointment, please call your pharmacy*   Lab Work: None Ordered If you have labs (blood work) drawn today and your tests are completely normal, you will receive your results only by: MyChart Message (if you have MyChart) OR A paper copy in the mail If you have any lab test that is abnormal or we need to change your treatment, we will call you to review the results.   Testing/Procedures: None Ordered   Follow-Up: At [facility], you and your health needs are our priority.  As part of our continuing mission to provide you with exceptional heart care, we have created designated Provider Care Teams.  These Care Teams include your primary Cardiologist (physician) and Advanced Practice Providers (APPs -  Physician Assistants and Nurse Practitioners) who all work together to provide you with the care you need, when you need it.  We recommend signing up for the patient portal called MyChart.  Sign up information is provided on this After Visit Summary.  MyChart is used to connect with patients for Virtual Visits (Telemedicine).  Patients are able to view lab/test results, encounter notes, upcoming appointments, etc.  Non-urgent messages can be sent to your provider as well.   To learn more about what you can do with MyChart, go to [email or web address].    Your next appointment:   5 month(s)  The format for your next appointment:   In Person  Provider:   Lamar Fitch, MD    Other Instructions  FDA-cleared personal EKG: The world's most clinically validated personal EKG, FDA-cleared to detect Atrial Fibrillation, Bradycardia, and Tachycardia. Catha is the most reliable way to check in on your heart from home. Take your EKG from  anywhere: Capture a medical-grade EKG in 30 seconds and get an instant analysis right on your smartphone. Catha is small enough to fit in your pocket, so you can take it with you anywhere. Easy to use: Simply place your fingers on the sensors--no wires, patches, or gels. Recommended by doctors: A trusted resource, Catha is the #1 doctor-recommended personal EKG with more than 100 million EKGs recorded. Save or share your EKGs: With the press of a button, email your EKGs to your doctor or save them on your phone. Works with smartphones: Compatible with [employer] and tablets. Check our compatibility chart. FSA/HSA eligible: Purchase using an FSA or HSA account (please confirm coverage with your insurance provider). Phone clip included with purchase, a $15 value. Conveniently take your device with you wherever you go.  [email or web address]   Step One- Record your EKG strip on [facility] app.   Step two- On Kardia EKG click "Download"   Step three- It will prompt you to make a password for this EKG. Please make the password "Pope" so that we can view it.   Step four- Click on the little "upload" button (small box with an arrow in the middle) in the bottom left-hand corner of the screen.   Step five- Click "Save to Files"  Step six- Click on "On my iphone" and then "Pages" then press save in the top right-hand corner.   NOW GO TO MYCHART   Once on MyChart click "Messages"  Step one- Click "Send a message"  Step two- Click "Ask a medical question"  Step three- Click "Non urgent medical question"   Step four- Click on Allison Pope's name.  Step five- Click on the small paperclip at the bottom of the screen  Step six- Click "Choose file"  Step seven- Pick the most recent EKG strip listed.   Once uploaded send the message!

## 2024-02-22 DIAGNOSIS — D649 Anemia, unspecified: Secondary | ICD-10-CM | POA: Diagnosis not present

## 2024-02-22 DIAGNOSIS — I10 Essential (primary) hypertension: Secondary | ICD-10-CM | POA: Diagnosis not present

## 2024-02-22 DIAGNOSIS — N3001 Acute cystitis with hematuria: Secondary | ICD-10-CM | POA: Diagnosis not present

## 2024-02-22 DIAGNOSIS — M25561 Pain in right knee: Secondary | ICD-10-CM | POA: Diagnosis not present

## 2024-02-22 DIAGNOSIS — Z202 Contact with and (suspected) exposure to infections with a predominantly sexual mode of transmission: Secondary | ICD-10-CM | POA: Diagnosis not present

## 2024-02-22 NOTE — Progress Notes (Signed)
  Behavioral Health Screening  Patient Health Questionnaire-2 Score: 0 (02/22/2024 10:11 AM)      Patient's Depression screening/score = Negative

## 2024-02-22 NOTE — Progress Notes (Signed)
 Note: Pt has given me a verbal consent to record today's clinic visit to support this Encounter note.  [facility] Name: Allison Pope Date of Birth: November 15, 1985 MRN: [id number] Visit Date: 02/22/2024  Assessment/Plan:    Assessment & Plan  Acute Cystitis w/ hematuria  - throbbing, spastic low abd pain  has been present for about a week and a half, with vaginal itching starting today. The pain is sharp and intermittent, occurring even without physical activity. Urine color has changed to orange. - A urine test and STD testing will be conducted. A pregnancy test will be done to confirm non-pregnant status.  -   POC Urinalysis without Microscopic (Manual Read) showed (+) blood, nitrites, leukocyte esterase -     Urine Culture; Future  - Start Sulfamethoxazole-trimethoprim (BACTRIM DS) 800-160 mg per tablet; Take 1 tablet by mouth 2 (two) times a day for 7 days. -     phenazopyridine (PYRIDIUM) 100 mg tablet; Take 1 tab po three times a day as needed for bladder spasm.Note: May cause urine to turn red - Increase fluid intake and rest. Avoid sexual activity while on antibiotic  Acute on Chronic bilateral knee pain: -  with the LEFT knee being worse and slightly swollen than the Right .  recently. Likely osteoarthritis.  No hx of fall or trauma injury recently.The pain is stiff, wakes her up at night, and feels like it might slip out of place when pivoting or going downstairs.  - Ibuprofen  800 mg three times a day with meals is prescribed, to be alternated with Tylenol  500 mg every 8 hours for 3 to 4 days.- Physical therapy is recommended to strengthen the muscles around the knee - An x-ray of the knees will be ordered to assess for any structural abnormalities. -  If there is no improvement after 6 to 8 weeks of physical therapy, a referral to orthopedics will be considered for potential steroid injections.  -    Take ibuprofen  (MOTRIN ) 800 mg tablet; Take 1  tablet (800 mg total) by mouth every 8 (eight) hours as needed for moderate pain (4-6) (Take it with meals).Alternate with Tylenol  500 mg po every 4-6 hrs for pain  - Supportive measures such as RICE approach (rest, ice/heating pad, knee brace or sleeve and elevate leg when on bed or chair) -     Ambulatory referral to [facility]); Future -     XR Knee 3 Views Right; Future -     XR Knee 3 Views Left; Future   Possible exposure to STD -         POC HCG Qualitative, Urine (Negative) -      HIV Screen with Reflex to Confirmation -     Hepatitis C Virus (HCV) Antibody Screen With Confirmation - -     Chlamydia / Gonococcus (GC), NAAT -     Rapid Plasma Reagin (RPR), Qualitative Test with Reflex to Titer  Hypertension  - Blood pressure is slightly elevated today, likely due to stress and pain. - The patient is advised to continue current medication and continue to monitor BP and record. She has recently followed up w/ her Cardiologist  - will draw blood as baseline , last labs from 2023. -     Comprehensive Metabolic Panel -     CBC with Differential  Follow-up: The patient will follow up in 6 weeks if there is no improvement in her knee pain.   I have  reviewed risks and benefits of medical treatment and provided pt with appropriate medication counseling. I have reviewed emergency symptoms and instructions specific to medical condition. Patient has been given an opportunity to ask questions regarding medical care.Patient has verbalized understanding of diagnosis,treatment plan and discharge instructions.   On the day of the visit I spent 45 minutes preparing to see the patient, obtaining and/or reviewing separately obtained history, performing a medically appropriate examination and evaluation, counseling and educating the patient/caregiver, ordering medications, test, or procedures, referring and communicating with other health care professionals (not separately reported), and documenting  clinical information in the electronic medical record.This time does not include any time spent performing procedures or assesments that are separately billable.      Subjective:   [name] is a 38 y.o. Caucasian female who is being seen at her employer based clinic.  Patient does not receive medical care elsewhere on an ongoing basis except that she has recently seen her Cardiologist to follow up A fib , HTN mgt.   Pt does not have a PCP and she was advised to get one , she is due for her Annual physical exam. Last time she has it done x 2 yrs ago  Chief Complaint  Patient presents with  . Pelvic Pain    Vaginal pain and itch with orange urine x 10 days. Denies flank pain, no burning with urination or frequency, vaginal discharge or odor.  She was with a new partner recently and used emergency contraception 01/31/24.  [name]    Chronic bilateral knee pain x over one year. Complains of pain, popping and locking. Has tried ibuprofen  200 mg two tablets once a day. Has also been using hydrocodone with Tylenol  10/325 mg uses one tablet divided into 1/8 portions over 10 days. Ice increases pain. Knee braces help while at work.     History of Present Illness The patient is a 38 year old female who presents for evaluation of pelvic pain, knee pain, and atrial fibrillation.  She has been experiencing lower abdominal pain and spasm for approximately 1.5 weeks, described as sharp and intermittent, occurring even during periods of rest. Vaginal itching began this morning, denies discharge.  Her urine has taken on an orange hue, but she reports no foul odor or vaginal discharge. She has not started any new medications recently. She took emergency contraception about a month ago and menstruated afterward, which was unusual as she had not had a period in 4 months prior to that. She has irregular menstrual cycles and it is normal for her to skip her periods for months at a time. She is  sexually active and did not use protection during her last sexual encounter, which was over a month ago. Her last pelvic exam was conducted approximately a year ago.   She has been dealing with chronic bilateral knee pain for the past year, with the left knee being more affected than the right. She rates her current pain level as 3 out of 10. She reports no history of recent falls or injuries to the knees. She has not had any imaging done for her knees. She experiences stiffness in her knees that disrupts her sleep and prevents her from sitting for extended periods or keeping her knee bent. She reports no locking of the knees but mentions a sensation of instability when pivoting or descending stairs.  On Saturday, she experienced shooting pain in both knees, which she describes as excruciating, extending down to  her ankle, accompanied by a burning sensation and redness of the kneecap. She finds relief from heat application and keeps her knee straight and elevated under her desk. She has been taking ibuprofen  200 mg twice daily for the past year. She also reports stiffness and occasional pain in other joints, which often wakes her up at night. She has been using hydrocodone, prescribed during an ER visit, and finds knee braces helpful while at work. Her job involves extensive walking.  She has a history of hypertension and atrial fibrillation, which are being managed by her cardiologist. Her blood pressure is always slightly elevated when she goes to the doctor. She has been seeing the same cardiologist for probably 7 or 8 years. They usually do an EKG when she is there. She had palpitations and was recommended to be put on a monitor, but she declined due to cost. Every time she has been put on a monitor, her results have been normal. She was also recommended to get a Kardia device, which she has at home but is unsure of its location. She has been on Toprol  for her atrial fibrillation.  Occupation: Orders  parts Alcohol: Consumes alcohol two or three times a week, approximately one or two drinks per occasion Tobacco: Does not smoke cigarettes Sleep: Reports disrupted sleep due to knee pain and stiffness, waking up approximately every 30 minutes at night Sexual Practices: Sexually active, did not use protection during last encounter  GYNECOLOGICAL HISTORY: Last Menstrual Period: 02/15/2024 Frequency and Flow: Irregular, normal to skip periods for months at a time  FAMILY HISTORY Her mother and grandmother had rheumatoid arthritis. Her father had atrial fibrillation. Her mother had breast cancer and melanoma in her eyeball.  Today's PHQ-2 results: Patient Health Questionnaire-2 Score: 0 (02/22/24 1011)    Interpretation: PHQ-2 Interpretation: Negative (None-minimal Depression Severity) (02/22/24 1011)   Depression Plan: Normal/Negative Screening   Review of Systems  Constitutional:  Positive for fatigue. Negative for chills and fever.  HENT: Negative.    Eyes: Negative.   Respiratory: Negative.    Cardiovascular: Negative.   Gastrointestinal: Negative.   Endocrine: Negative.   Genitourinary:  Positive for dysuria, hematuria and menstrual problem. Negative for vaginal bleeding, vaginal discharge and vaginal pain.  Musculoskeletal:  Positive for arthralgias and joint swelling.  Skin: Negative.   Neurological: Negative.   Psychiatric/Behavioral:  Positive for dysphoric mood. The patient is nervous/anxious.    All other systems negative unless noted in HPI  The following portions of the patient's history were reviewed and updated as appropriate: allergies, current medications, past medical history.   Objective:   Vital Signs Vitals:   02/22/24 1022  BP: 130/88  Pulse: 78  Resp:   Temp:   SpO2:    Body mass index is 27.62 kg/m.  LMP 02/15/24. G0P0  Physical Exam Constitutional:      General: She is in acute distress.  HENT:     Head: Normocephalic and atraumatic.      Right Ear: External ear normal.     Left Ear: External ear normal.     Nose: Nose normal.     Mouth/Throat:     Mouth: Mucous membranes are dry.  Eyes:     General: No scleral icterus.       Right eye: No discharge.        Left eye: No discharge.     Conjunctiva/sclera: Conjunctivae normal.  Neck:     Vascular: No carotid bruit.  Cardiovascular:  Rate and Rhythm: Normal rate and regular rhythm.     Pulses: Normal pulses.     Heart sounds: Normal heart sounds.  Pulmonary:     Effort: Pulmonary effort is normal.     Breath sounds: Normal breath sounds.  Abdominal:     General: Bowel sounds are normal. There is no distension.     Palpations: Abdomen is soft.  Musculoskeletal:        General: Swelling and tenderness present. No deformity. Normal range of motion.     Cervical back: Normal range of motion and neck supple.     Right lower leg: No edema.     Left lower leg: No edema.     Comments:  Slightly swollen on Left knee cap (anterior) , not warm and tender to touch. Negative drawer, negative McMurray test,  Lymphadenopathy:     Cervical: No cervical adenopathy.  Skin:    Findings: No bruising, erythema, lesion or rash.  Neurological:     General: No focal deficit present.     Mental Status: She is alert and oriented to person, place, and time.     Gait: Gait normal.     Deep Tendon Reflexes: Reflexes normal.  Psychiatric:        Thought Content: Thought content normal.        Judgment: Judgment normal.     Comments: Sad mood, appears slightly anxious       Electronically signed by: Freddrick JAYSON Dolores, FNP 02/22/2024 3:52 PM

## 2024-02-27 ENCOUNTER — Other Ambulatory Visit: Payer: Self-pay | Admitting: Cardiology

## 2024-03-01 DIAGNOSIS — E559 Vitamin D deficiency, unspecified: Secondary | ICD-10-CM | POA: Diagnosis not present

## 2024-03-01 DIAGNOSIS — E876 Hypokalemia: Secondary | ICD-10-CM | POA: Diagnosis not present

## 2024-03-01 DIAGNOSIS — R7989 Other specified abnormal findings of blood chemistry: Secondary | ICD-10-CM | POA: Diagnosis not present

## 2024-03-01 DIAGNOSIS — R5383 Other fatigue: Secondary | ICD-10-CM | POA: Diagnosis not present

## 2024-03-04 ENCOUNTER — Emergency Department (HOSPITAL_BASED_OUTPATIENT_CLINIC_OR_DEPARTMENT_OTHER)

## 2024-03-04 ENCOUNTER — Encounter (HOSPITAL_BASED_OUTPATIENT_CLINIC_OR_DEPARTMENT_OTHER): Payer: Self-pay

## 2024-03-04 ENCOUNTER — Emergency Department (HOSPITAL_BASED_OUTPATIENT_CLINIC_OR_DEPARTMENT_OTHER)
Admission: EM | Admit: 2024-03-04 | Discharge: 2024-03-04 | Disposition: A | Discharge status: Home / Self Care | Attending: Emergency Medicine | Admitting: Emergency Medicine

## 2024-03-04 ENCOUNTER — Other Ambulatory Visit: Payer: Self-pay

## 2024-03-04 DIAGNOSIS — R7401 Elevation of levels of liver transaminase levels: Secondary | ICD-10-CM | POA: Diagnosis not present

## 2024-03-04 DIAGNOSIS — R112 Nausea with vomiting, unspecified: Secondary | ICD-10-CM | POA: Diagnosis not present

## 2024-03-04 DIAGNOSIS — D649 Anemia, unspecified: Secondary | ICD-10-CM | POA: Diagnosis not present

## 2024-03-04 DIAGNOSIS — E860 Dehydration: Secondary | ICD-10-CM | POA: Diagnosis not present

## 2024-03-04 DIAGNOSIS — R531 Weakness: Secondary | ICD-10-CM | POA: Diagnosis not present

## 2024-03-04 DIAGNOSIS — K922 Gastrointestinal hemorrhage, unspecified: Secondary | ICD-10-CM | POA: Insufficient documentation

## 2024-03-04 DIAGNOSIS — R0602 Shortness of breath: Secondary | ICD-10-CM | POA: Diagnosis not present

## 2024-03-04 LAB — RESP PANEL BY RT-PCR (RSV, FLU A&B, COVID)  RVPGX2
Influenza A by PCR: NEGATIVE
Influenza B by PCR: NEGATIVE
Resp Syncytial Virus by PCR: NEGATIVE
SARS Coronavirus 2 by RT PCR: NEGATIVE

## 2024-03-04 LAB — VITAMIN B12: Vitamin B-12: 1899 pg/mL — ABNORMAL HIGH (ref 180–914)

## 2024-03-04 LAB — CBC
HCT: 27.1 % — ABNORMAL LOW (ref 36.0–46.0)
Hemoglobin: 9.3 g/dL — ABNORMAL LOW (ref 12.0–15.0)
MCH: 39.9 pg — ABNORMAL HIGH (ref 26.0–34.0)
MCHC: 34.3 g/dL (ref 30.0–36.0)
MCV: 116.3 fL — ABNORMAL HIGH (ref 80.0–100.0)
Platelets: 288 K/uL (ref 150–400)
RBC: 2.33 MIL/uL — ABNORMAL LOW (ref 3.87–5.11)
RDW: 15.8 % — ABNORMAL HIGH (ref 11.5–15.5)
WBC: 5.7 K/uL (ref 4.0–10.5)
nRBC: 0.5 % — ABNORMAL HIGH (ref 0.0–0.2)

## 2024-03-04 LAB — IRON AND TIBC
Iron: 37 ug/dL (ref 28–170)
Saturation Ratios: 17 % (ref 10.4–31.8)
TIBC: 224 ug/dL — ABNORMAL LOW (ref 250–450)
UIBC: 187 ug/dL

## 2024-03-04 LAB — BASIC METABOLIC PANEL WITH GFR
Anion gap: 19 — ABNORMAL HIGH (ref 5–15)
Anion gap: 13 (ref 5–15)
BUN: 26 mg/dL — ABNORMAL HIGH (ref 6–20)
BUN: 25 mg/dL — ABNORMAL HIGH (ref 6–20)
CO2: 16 mmol/L — ABNORMAL LOW (ref 22–32)
CO2: 16 mmol/L — ABNORMAL LOW (ref 22–32)
Calcium: 9.5 mg/dL (ref 8.9–10.3)
Calcium: 8.2 mg/dL — ABNORMAL LOW (ref 8.9–10.3)
Chloride: 95 mmol/L — ABNORMAL LOW (ref 98–111)
Chloride: 103 mmol/L (ref 98–111)
Creatinine, Ser: 1.46 mg/dL — ABNORMAL HIGH (ref 0.44–1.00)
Creatinine, Ser: 1.09 mg/dL — ABNORMAL HIGH (ref 0.44–1.00)
GFR, Estimated: 46 mL/min — ABNORMAL LOW (ref >60)
GFR, Estimated: >60 mL/min (ref >60)
Glucose, Bld: 98 mg/dL (ref 70–99)
Glucose, Bld: 86 mg/dL (ref 70–99)
Potassium: 4.2 mmol/L (ref 3.5–5.1)
Potassium: 5.1 mmol/L (ref 3.5–5.1)
Sodium: 130 mmol/L — ABNORMAL LOW (ref 135–145)
Sodium: 132 mmol/L — ABNORMAL LOW (ref 135–145)

## 2024-03-04 LAB — HEPATIC FUNCTION PANEL
ALT: 78 U/L — ABNORMAL HIGH (ref 0–44)
AST: 165 U/L — ABNORMAL HIGH (ref 15–41)
Albumin: 4.1 g/dL (ref 3.5–5.0)
Alkaline Phosphatase: 169 U/L — ABNORMAL HIGH (ref 38–126)
Bilirubin, Direct: 0.7 mg/dL — ABNORMAL HIGH (ref 0.0–0.2)
Indirect Bilirubin: 0.4 mg/dL (ref 0.3–0.9)
Total Bilirubin: 1.1 mg/dL (ref 0.0–1.2)
Total Protein: 8.4 g/dL — ABNORMAL HIGH (ref 6.5–8.1)

## 2024-03-04 LAB — RETICULOCYTES
Immature Retic Fract: 30.3 % — ABNORMAL HIGH (ref 2.3–15.9)
RBC.: 2.34 MIL/uL — ABNORMAL LOW (ref 3.87–5.11)
Retic Count, Absolute: 61.1 K/uL (ref 19.0–186.0)
Retic Ct Pct: 2.6 % (ref 0.4–3.1)

## 2024-03-04 LAB — TROPONIN T, HIGH SENSITIVITY
Troponin T High Sensitivity: <15 ng/L (ref 0–19)
Troponin T High Sensitivity: <15 ng/L (ref 0–19)

## 2024-03-04 LAB — PREGNANCY, URINE: Preg Test, Ur: NEGATIVE

## 2024-03-04 LAB — FOLATE: Folate: 4.8 ng/mL — ABNORMAL LOW (ref >5.9)

## 2024-03-04 LAB — OCCULT BLOOD X 1 CARD TO LAB, STOOL: Fecal Occult Bld: POSITIVE — AB

## 2024-03-04 LAB — MAGNESIUM: Magnesium: 1.5 mg/dL — ABNORMAL LOW (ref 1.7–2.4)

## 2024-03-04 LAB — FERRITIN: Ferritin: 1242 ng/mL — ABNORMAL HIGH (ref 11–307)

## 2024-03-04 MED ORDER — FOLIC ACID 1 MG PO TABS: 1.0000 mg | ORAL_TABLET | Freq: Every day | ORAL | 0 refills | Status: AC

## 2024-03-04 MED ORDER — SODIUM CHLORIDE 0.9 % IV BOLUS
1000.0000 mL | Freq: Once | INTRAVENOUS | Status: AC
  Administered 2024-03-04: 1000 mL via INTRAVENOUS

## 2024-03-04 MED ORDER — PANTOPRAZOLE SODIUM 40 MG PO TBEC: 40.0000 mg | DELAYED_RELEASE_TABLET | Freq: Every day | ORAL | 0 refills | Status: AC

## 2024-03-04 MED ORDER — MAGNESIUM SULFATE 2 GM/50ML IV SOLN
2.0000 g | Freq: Once | INTRAVENOUS | Status: AC
Start: 2024-03-04 — End: 2024-03-04
  Administered 2024-03-04: 2 g via INTRAVENOUS
  Filled 2024-03-04: qty 50

## 2024-03-04 MED ORDER — FOLIC ACID 1 MG PO TABS
1.0000 mg | ORAL_TABLET | Freq: Once | ORAL | Status: AC
  Administered 2024-03-04: 1 mg via ORAL
  Filled 2024-03-04: qty 1

## 2024-03-04 MED ORDER — ONDANSETRON HCL 4 MG/2ML IJ SOLN
4.0000 mg | Freq: Once | INTRAMUSCULAR | Status: AC
  Administered 2024-03-04: 4 mg via INTRAVENOUS
  Filled 2024-03-04: qty 2

## 2024-03-04 MED ORDER — ONDANSETRON 4 MG PO TBDP: 4.0000 mg | ORAL_TABLET | Freq: Three times a day (TID) | ORAL | 0 refills | Status: AC | PRN

## 2024-03-04 MED ORDER — FERROUS SULFATE 325 (65 FE) MG PO TABS: 325.0000 mg | ORAL_TABLET | Freq: Every day | ORAL | 0 refills | Status: AC

## 2024-03-04 NOTE — ED Notes (Signed)
 Report received from [place], [place]. Assuming pt care at this time.

## 2024-03-04 NOTE — ED Provider Notes (Signed)
 [place] [facility] Provider Note   CSN: [id number] Arrival date & time: 03/04/24  1037     Patient presents with: multiple complaints   Allison Pope is a 38 y.o. female.   38 year old female presents with complaint of nausea, vomiting, diarrhea, chest pain, shortness of breath and dizziness.  Symptoms started 2 weeks ago after she was treated for a urinary tract infection with Bactrim, developed vomiting and diarrhea.  Progressed to remaining symptoms.  No known sick contacts, no recent travel.  States her stools have been dark and loose.  She is not anticoagulated.  She states that she had COVID 2 months ago, has felt short of breath since that time off and on but worse today.  Is worse with exertion.  No recent fevers.  Past medical history of mitral valve prolapse and paroxysmal A-fib, pancreatitis, alcohol abuse (denies heavy drinking currently).       Prior to Admission medications   Medication Sig Start Date End Date Taking? Authorizing Provider  ferrous sulfate  325 (65 FE) MG tablet Take 1 tablet (325 mg total) by mouth daily. 03/04/24  Yes Beverley Leita LABOR, PA-C  folic acid  (FOLVITE ) 1 MG tablet Take 1 tablet (1 mg total) by mouth daily. 03/04/24  Yes Beverley Leita LABOR, PA-C  ondansetron  (ZOFRAN -ODT) 4 MG disintegrating tablet Take 1 tablet (4 mg total) by mouth every 8 (eight) hours as needed for nausea or vomiting. 03/04/24  Yes Beverley Leita LABOR, PA-C  pantoprazole  (PROTONIX ) 40 MG tablet Take 1 tablet (40 mg total) by mouth daily. 03/04/24  Yes Beverley Leita LABOR, PA-C  losartan  (COZAAR ) 50 MG tablet Take 1 tablet (50 mg total) by mouth daily. Patient must keep appointment on 02/11/24 for further refills. 1 st attempt 01/20/24   Krasowski, Robert J, MD  metoprolol  succinate (TOPROL -XL) 50 MG 24 hr tablet TAKE 1 TABLET BY MOUTH EVERY DAY WITH OR IMMEDIATELY FOLLOWING A MEAL 03/01/24   Krasowski, Robert J, MD    Allergies: Patient has no known  allergies.    Review of Systems Negative except as per HPI Updated Vital Signs BP 118/87 (BP Location: Right Arm)   Pulse 66   Temp 98.9 F (37.2 C) (Oral)   Resp 16   Ht 5' 5 (1.651 m)   Wt 74.8 kg   LMP 02/05/2024 (Approximate)   SpO2 100%   BMI 27.46 kg/m   Physical Exam Vitals and nursing note reviewed. Exam conducted with a chaperone present.  Constitutional:      General: She is not in acute distress.    Appearance: She is well-developed. She is not diaphoretic.  HENT:     Head: Normocephalic and atraumatic.  Eyes:     Conjunctiva/sclera: Conjunctivae normal.  Cardiovascular:     Rate and Rhythm: Normal rate and regular rhythm.     Heart sounds: Normal heart sounds.  Pulmonary:     Effort: Pulmonary effort is normal.     Breath sounds: Normal breath sounds.  Abdominal:     Palpations: Abdomen is soft.     Tenderness: There is no abdominal tenderness.  Genitourinary:    Rectum: Guaiac result positive.  Musculoskeletal:     Cervical back: Neck supple.     Right lower leg: No edema.     Left lower leg: No edema.  Skin:    General: Skin is warm and dry.     Findings: No erythema or rash.  Neurological:     Mental  Status: She is alert and oriented to person, place, and time.  Psychiatric:        Behavior: Behavior normal.     (all labs ordered are listed, but only abnormal results are displayed) Labs Reviewed  BASIC METABOLIC PANEL WITH GFR - Abnormal; Notable for the following components:      Result Value   Sodium 130 (*)    Chloride 95 (*)    CO2 16 (*)    BUN 26 (*)    Creatinine, Ser 1.46 (*)    GFR, Estimated 46 (*)    Anion gap 19 (*)    All other components within normal limits  CBC - Abnormal; Notable for the following components:   RBC 2.33 (*)    Hemoglobin 9.3 (*)    HCT 27.1 (*)    MCV 116.3 (*)    MCH 39.9 (*)    RDW 15.8 (*)    nRBC 0.5 (*)    All other components within normal limits  HEPATIC FUNCTION PANEL - Abnormal; Notable  for the following components:   Total Protein 8.4 (*)    AST 165 (*)    ALT 78 (*)    Alkaline Phosphatase 169 (*)    Bilirubin, Direct 0.7 (*)    All other components within normal limits  RETICULOCYTES - Abnormal; Notable for the following components:   RBC. 2.34 (*)    Immature Retic Fract 30.3 (*)    All other components within normal limits  MAGNESIUM  - Abnormal; Notable for the following components:   Magnesium  1.5 (*)    All other components within normal limits  BASIC METABOLIC PANEL WITH GFR - Abnormal; Notable for the following components:   Sodium 132 (*)    CO2 16 (*)    BUN 25 (*)    Creatinine, Ser 1.09 (*)    Calcium 8.2 (*)    All other components within normal limits  OCCULT BLOOD X 1 CARD TO LAB, STOOL - Abnormal; Notable for the following components:   Fecal Occult Bld POSITIVE (*)    All other components within normal limits  RESP PANEL BY RT-PCR (RSV, FLU A&B, COVID)  RVPGX2  PREGNANCY, URINE  VITAMIN B12  FOLATE  IRON AND TIBC  FERRITIN  POC OCCULT BLOOD, ED  TROPONIN T, HIGH SENSITIVITY  TROPONIN T, HIGH SENSITIVITY    EKG: EKG Interpretation Date/Time:  Friday March 04 2024 10:46:05 EST Ventricular Rate:  84 PR Interval:  177 QRS Duration:  94 QT Interval:  369 QTC Calculation: 437 R Axis:   49  Text Interpretation: Sinus rhythm similar to prior Confirmed by Elnor Savant ([id number]) on 03/04/2024 1:54:30 PM  Radiology: [facility] Chest Port 1 View Result Date: 03/04/2024 EXAM: 1 VIEW(S) XRAY OF THE CHEST 03/04/2024 11:19:00 AM COMPARISON: 07/23/21. CLINICAL HISTORY: SOB (shortness of breath) FINDINGS: LINES, TUBES AND DEVICES: Overlying monitor wires. LUNGS AND PLEURA: No focal pulmonary opacity. No pleural effusion. No pneumothorax. HEART AND MEDIASTINUM: No acute abnormality of the cardiac and mediastinal silhouettes. BONES AND SOFT TISSUES: No acute osseous abnormality. IMPRESSION: 1. No acute cardiopulmonary process. Electronically signed by: Waddell Calk MD 03/04/2024 12:14 PM EST RP Workstation: [id number]     Procedures   Medications Ordered in the ED  sodium chloride  0.9 % bolus 1,000 mL (0 mLs Intravenous Stopped 03/04/24 1504)  sodium chloride  0.9 % bolus 1,000 mL (0 mLs Intravenous Stopped 03/04/24 1454)  magnesium  sulfate IVPB 2 g 50 mL (0 g Intravenous Stopped 03/04/24 1505)  folic acid  (FOLVITE ) tablet 1 mg (1 mg Oral Given 03/04/24 1345)  ondansetron  (ZOFRAN ) injection 4 mg (4 mg Intravenous Given 03/04/24 1532)                                    Medical Decision Making Amount and/or Complexity of Data Reviewed Labs: ordered. Radiology: ordered.  Risk OTC drugs. Prescription drug management.   This patient presents to the ED for concern of n/v/d, feeling poorly, weak, CP, SHOB, this involves an extensive number of treatment options, and is a complaint that carries with it a high risk of complications and morbidity.  The differential diagnosis includes but not limited to ACS, anemia, metabolic/electrolyte, GI bleed   Co morbidities / Chronic conditions that complicate the patient evaluation  Pancreatitis, paroxysmal A-fib, not anticoagulated, MVP, hypertension, prior alcohol abuse, currently denies.   Additional history obtained:  Additional history obtained from EMR External records from outside source obtained and reviewed including prior labs and imaging.  Outside labs reviewed, appears to have an iron deficiency anemia.   Lab Tests:  I Ordered, and personally interpreted labs.  The pertinent results include: hCG negative.  CBC with hemoglobin of 9.3, not significantly changed compared to prior obtained earlier this month.  Metabolic panel with elevated creatinine, gap of 19, bicarb of 16.  Metabolic panel repeated, bicarb remains low, gap has improved, creatinine improved.  Hepatic function with elevated AST, ALT and alk phos.  Magnesium  is low at 1.5.  Hemoccult positive.  Negative for COVID, flu, RSV.   Troponin is negative.   Imaging Studies ordered:  I ordered imaging studies including chest x-ray I independently visualized and interpreted imaging which showed no acute process I agree with the radiologist interpretation   Cardiac Monitoring: / EKG:  The patient was maintained on a cardiac monitor.  I personally viewed and interpreted the cardiac monitored which showed an underlying rhythm of: Sinus rhythm, rate 84   Problem List / ED Course / Critical interventions / Medication management  38 year old female presents with complaint as above.  On initial exam, appears to be feeling unwell.  Labs assessed, found to have hypomagnesemia which was replaced with IV.  Provided with IV fluids with improvement in her metabolic acidosis.  Patient is anemic with hemoglobin of 9.3.  This is down compared to several years ago however stable compared to labs obtained earlier this month.  She notes that her diarrhea has been dark, she is found to have positive Hemoccult, there was insufficient stool on the gloved finger to comment further on the stool appearance.  Her AST and ALT are elevated.  Question secondary to alcohol use although patient denies.  Prior CT scan does show steatosis.  Alk phos is elevated, possibly secondary to all of her vomiting.  Plan is to discharge on Protonix  with referral to GI.  Will also discharge with folate given her low folate level on outside labs.  Also iron supplement given iron deficiency anemia on outside labs.  Zofran  for vomiting.  Patient to follow-up with primary care provider for recheck.  Call GI to schedule follow-up and return to ER for any worsening or concerning symptoms. I ordered medication including zofran , folvite , IVF, magnesium     Reevaluation of the patient after these medicines showed that the patient improving  I have reviewed the patients home medicines and have made adjustments as needed   Consultations Obtained:  I requested  consultation with  the ER attending, Dr. Elnor,  and discussed lab and imaging findings as well as pertinent plan - they recommend: agrees with plan of care   Social Determinants of Health:  Has PCP, has never seen GI   Test / Admission - Considered:  Stable for dc      Final diagnoses:  Weakness  Dehydration  Gastrointestinal hemorrhage, unspecified gastrointestinal hemorrhage type  Anemia, unspecified type  Hypomagnesemia  Transaminitis    ED Discharge Orders          Ordered    folic acid  (FOLVITE ) 1 MG tablet  Daily        03/04/24 1710    ferrous sulfate  325 (65 FE) MG tablet  Daily        03/04/24 1710    pantoprazole  (PROTONIX ) 40 MG tablet  Daily        03/04/24 1710    ondansetron  (ZOFRAN -ODT) 4 MG disintegrating tablet  Every 8 hours PRN        03/04/24 1712               Beverley Leita LABOR, PA-C 03/04/24 1832    Elnor Jayson LABOR, DO 03/07/24 [phone]

## 2024-03-04 NOTE — ED Triage Notes (Signed)
 Pt states that she has been sick 2 weeks with nausea, vomiting, diarrhea, chest  pain and shortness of breath. States that she has had covid 4 times and her symptoms have ongoing. States that the shortness of breath is worse when she is moving. Pt also has a rash.
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
## 2024-03-04 NOTE — Discharge Instructions (Signed)
 Follow up with GI-call on Monday to schedule appointment. Recheck with your primary care provider. Return to the emergency room for worsening or concerning symptoms.  Take an iron supplement daily.   Take Protonix  daily as prescribed.   Take folate vitamin daily as prescribed.   Take Zofran  as needed as prescribed vomiting.

## 2024-03-05 ENCOUNTER — Other Ambulatory Visit: Payer: Self-pay | Admitting: Cardiology

## 2024-03-07 DIAGNOSIS — R7401 Elevation of levels of liver transaminase levels: Secondary | ICD-10-CM | POA: Diagnosis not present

## 2024-03-07 DIAGNOSIS — F1090 Alcohol use, unspecified, uncomplicated: Secondary | ICD-10-CM | POA: Diagnosis not present

## 2024-03-07 DIAGNOSIS — D696 Thrombocytopenia, unspecified: Secondary | ICD-10-CM | POA: Diagnosis not present

## 2024-03-07 DIAGNOSIS — D649 Anemia, unspecified: Secondary | ICD-10-CM | POA: Diagnosis not present

## 2024-03-07 DIAGNOSIS — R031 Nonspecific low blood-pressure reading: Secondary | ICD-10-CM | POA: Diagnosis not present

## 2024-03-08 DIAGNOSIS — R634 Abnormal weight loss: Secondary | ICD-10-CM | POA: Diagnosis not present

## 2024-03-08 DIAGNOSIS — K921 Melena: Secondary | ICD-10-CM | POA: Diagnosis not present

## 2024-03-08 DIAGNOSIS — R197 Diarrhea, unspecified: Secondary | ICD-10-CM | POA: Diagnosis not present

## 2024-03-08 DIAGNOSIS — F1090 Alcohol use, unspecified, uncomplicated: Secondary | ICD-10-CM | POA: Diagnosis not present

## 2024-03-11 DIAGNOSIS — K760 Fatty (change of) liver, not elsewhere classified: Secondary | ICD-10-CM | POA: Diagnosis not present

## 2024-03-11 DIAGNOSIS — R160 Hepatomegaly, not elsewhere classified: Secondary | ICD-10-CM | POA: Diagnosis not present

## 2024-03-11 DIAGNOSIS — K862 Cyst of pancreas: Secondary | ICD-10-CM | POA: Diagnosis not present

## 2024-03-11 DIAGNOSIS — K8689 Other specified diseases of pancreas: Secondary | ICD-10-CM | POA: Diagnosis not present

## 2024-03-15 DIAGNOSIS — I10 Essential (primary) hypertension: Secondary | ICD-10-CM | POA: Diagnosis not present

## 2024-03-15 DIAGNOSIS — F321 Major depressive disorder, single episode, moderate: Secondary | ICD-10-CM | POA: Diagnosis not present

## 2024-03-15 DIAGNOSIS — I480 Paroxysmal atrial fibrillation: Secondary | ICD-10-CM | POA: Diagnosis not present

## 2024-04-11 ENCOUNTER — Other Ambulatory Visit: Payer: Self-pay

## 2024-04-11 ENCOUNTER — Encounter (HOSPITAL_COMMUNITY): Payer: Self-pay | Admitting: Emergency Medicine

## 2024-04-11 ENCOUNTER — Emergency Department (HOSPITAL_COMMUNITY): Payer: Self-pay

## 2024-04-11 ENCOUNTER — Emergency Department (HOSPITAL_COMMUNITY)
Admission: EM | Admit: 2024-04-11 | Discharge: 2024-04-11 | Disposition: A | Discharge status: Home / Self Care | Payer: Self-pay | Attending: Emergency Medicine | Admitting: Emergency Medicine

## 2024-04-11 DIAGNOSIS — S01552A Open bite of oral cavity, initial encounter: Secondary | ICD-10-CM | POA: Insufficient documentation

## 2024-04-11 DIAGNOSIS — Y900 Blood alcohol level of less than 20 mg/100 ml: Secondary | ICD-10-CM | POA: Insufficient documentation

## 2024-04-11 DIAGNOSIS — R569 Unspecified convulsions: Secondary | ICD-10-CM | POA: Insufficient documentation

## 2024-04-11 DIAGNOSIS — T7411XA Adult physical abuse, confirmed, initial encounter: Secondary | ICD-10-CM | POA: Insufficient documentation

## 2024-04-11 DIAGNOSIS — S0990XA Unspecified injury of head, initial encounter: Secondary | ICD-10-CM | POA: Insufficient documentation

## 2024-04-11 DIAGNOSIS — W19XXXA Unspecified fall, initial encounter: Secondary | ICD-10-CM | POA: Insufficient documentation

## 2024-04-11 DIAGNOSIS — F1010 Alcohol abuse, uncomplicated: Secondary | ICD-10-CM | POA: Insufficient documentation

## 2024-04-11 LAB — COMPREHENSIVE METABOLIC PANEL WITH GFR
ALT: 33 U/L (ref 0–44)
AST: 142 U/L — ABNORMAL HIGH (ref 15–41)
Albumin: 3.7 g/dL (ref 3.5–5.0)
Alkaline Phosphatase: 191 U/L — ABNORMAL HIGH (ref 38–126)
Anion gap: 17 — ABNORMAL HIGH (ref 5–15)
BUN: 13 mg/dL (ref 6–20)
CO2: 22 mmol/L (ref 22–32)
Calcium: 8.9 mg/dL (ref 8.9–10.3)
Chloride: 95 mmol/L — ABNORMAL LOW (ref 98–111)
Creatinine, Ser: 0.97 mg/dL (ref 0.44–1.00)
GFR, Estimated: >60 mL/min
Glucose, Bld: 86 mg/dL (ref 70–99)
Potassium: 3.3 mmol/L — ABNORMAL LOW (ref 3.5–5.1)
Sodium: 134 mmol/L — ABNORMAL LOW (ref 135–145)
Total Bilirubin: 2.1 mg/dL — ABNORMAL HIGH (ref 0.0–1.2)
Total Protein: 8.2 g/dL — ABNORMAL HIGH (ref 6.5–8.1)

## 2024-04-11 LAB — URINALYSIS, ROUTINE W REFLEX MICROSCOPIC
Bilirubin Urine: NEGATIVE
Glucose, UA: NEGATIVE mg/dL
Hgb urine dipstick: NEGATIVE
Ketones, ur: 5 mg/dL — AB
Nitrite: NEGATIVE
Protein, ur: 30 mg/dL — AB
Specific Gravity, Urine: 1.01 (ref 1.005–1.030)
pH: 6 (ref 5.0–8.0)

## 2024-04-11 LAB — CBG MONITORING, ED
Glucose-Capillary: 93 mg/dL (ref 70–99)
Glucose-Capillary: 94 mg/dL (ref 70–99)

## 2024-04-11 LAB — CBC
HCT: 29.6 % — ABNORMAL LOW (ref 36.0–46.0)
Hemoglobin: 10 g/dL — ABNORMAL LOW (ref 12.0–15.0)
MCH: 37.6 pg — ABNORMAL HIGH (ref 26.0–34.0)
MCHC: 33.8 g/dL (ref 30.0–36.0)
MCV: 111.3 fL — ABNORMAL HIGH (ref 80.0–100.0)
Platelets: 148 K/uL — ABNORMAL LOW (ref 150–400)
RBC: 2.66 MIL/uL — ABNORMAL LOW (ref 3.87–5.11)
RDW: 15.9 % — ABNORMAL HIGH (ref 11.5–15.5)
WBC: 6.5 K/uL (ref 4.0–10.5)
nRBC: 0 % (ref 0.0–0.2)

## 2024-04-11 LAB — ETHANOL: Alcohol, Ethyl (B): <15 mg/dL

## 2024-04-11 LAB — HCG, SERUM, QUALITATIVE: Preg, Serum: NEGATIVE

## 2024-04-11 LAB — MAGNESIUM: Magnesium: 0.9 mg/dL — CL (ref 1.7–2.4)

## 2024-04-11 MED ORDER — LEVETIRACETAM (KEPPRA) 500 MG/5 ML ADULT IV PUSH
20.0000 mg/kg | Freq: Once | INTRAVENOUS | Status: AC
  Administered 2024-04-11: 1500 mg via INTRAVENOUS
  Filled 2024-04-11: qty 15

## 2024-04-11 MED ORDER — ACETAMINOPHEN 500 MG PO TABS
1000.0000 mg | ORAL_TABLET | Freq: Once | ORAL | Status: AC
  Administered 2024-04-11: 1000 mg via ORAL
  Filled 2024-04-11: qty 2

## 2024-04-11 MED ORDER — CHLORDIAZEPOXIDE HCL 25 MG PO CAPS: ORAL_CAPSULE | ORAL | 0 refills | Status: AC

## 2024-04-11 MED ORDER — THIAMINE HCL 100 MG PO TABS: 100.0000 mg | ORAL_TABLET | Freq: Every day | ORAL | 0 refills | Status: DC

## 2024-04-11 MED ORDER — SODIUM CHLORIDE 0.9 % IV BOLUS: 1000.0000 mL | Freq: Once | INTRAVENOUS | Status: DC

## 2024-04-11 MED ORDER — LEVETIRACETAM 500 MG PO TABS: 500.0000 mg | ORAL_TABLET | Freq: Two times a day (BID) | ORAL | 0 refills | Status: AC

## 2024-04-11 MED ORDER — THIAMINE HCL 100 MG PO TABS
100.0000 mg | ORAL_TABLET | Freq: Once | ORAL | Status: DC
  Filled 2024-04-11 (×2): qty 1

## 2024-04-11 MED ORDER — THIAMINE HCL 100 MG/ML IJ SOLN
100.0000 mg | Freq: Once | INTRAMUSCULAR | Status: AC
  Administered 2024-04-11: 100 mg via INTRAVENOUS
  Filled 2024-04-11: qty 2

## 2024-04-11 MED ORDER — LORAZEPAM 2 MG/ML IJ SOLN
1.0000 mg | Freq: Once | INTRAMUSCULAR | Status: AC
  Administered 2024-04-11: 1 mg via INTRAVENOUS
  Filled 2024-04-11: qty 1

## 2024-04-11 MED ORDER — IBUPROFEN 400 MG PO TABS: 600.0000 mg | ORAL_TABLET | Freq: Once | ORAL | Status: DC

## 2024-04-11 MED ORDER — CHLORDIAZEPOXIDE HCL 25 MG PO CAPS: ORAL_CAPSULE | ORAL | 0 refills | Status: DC

## 2024-04-11 MED ORDER — SODIUM CHLORIDE 0.9 % IV BOLUS
1000.0000 mL | Freq: Once | INTRAVENOUS | Status: AC
  Administered 2024-04-11: 1000 mL via INTRAVENOUS

## 2024-04-11 MED ORDER — MAGNESIUM SULFATE 4 GM/100ML IV SOLN
4.0000 g | Freq: Once | INTRAVENOUS | Status: AC
  Administered 2024-04-11: 2 g via INTRAVENOUS

## 2024-04-11 MED ORDER — THIAMINE HCL 100 MG PO TABS: 100.0000 mg | ORAL_TABLET | Freq: Every day | ORAL | 0 refills | Status: AC

## 2024-04-11 MED ORDER — MAGNESIUM SULFATE 2 GM/50ML IV SOLN
2.0000 g | Freq: Once | INTRAVENOUS | Status: DC
  Filled 2024-04-11: qty 50

## 2024-04-11 MED ORDER — LEVETIRACETAM 500 MG PO TABS: 500.0000 mg | ORAL_TABLET | Freq: Two times a day (BID) | ORAL | 0 refills | Status: DC
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
## 2024-04-11 NOTE — ED Triage Notes (Signed)
 Pt reports daily alcohol and unsure how much she has drank today.
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
## 2024-04-11 NOTE — ED Triage Notes (Signed)
 Pt state head pain resolved on its own. And has no complaints at this time.

## 2024-07-08 ENCOUNTER — Ambulatory Visit: Payer: Self-pay | Admitting: Cardiology

## 2024-08-03 ENCOUNTER — Ambulatory Visit: Payer: Self-pay | Admitting: Neurology
